# Patient Record
Sex: Female | Born: 1955 | Race: White | Hispanic: No | Smoking: Never smoker
Health system: Southern US, Community
[De-identification: ages and names within clinical notes are randomized; demographics above are authoritative.]

## PROBLEM LIST (undated history)

## (undated) DIAGNOSIS — H3530 Unspecified macular degeneration: Secondary | ICD-10-CM

## (undated) DIAGNOSIS — E669 Obesity, unspecified: Secondary | ICD-10-CM

## (undated) DIAGNOSIS — K5190 Ulcerative colitis, unspecified, without complications: Secondary | ICD-10-CM

## (undated) DIAGNOSIS — J45909 Unspecified asthma, uncomplicated: Secondary | ICD-10-CM

## (undated) DIAGNOSIS — E785 Hyperlipidemia, unspecified: Secondary | ICD-10-CM

## (undated) DIAGNOSIS — M810 Age-related osteoporosis without current pathological fracture: Secondary | ICD-10-CM

## (undated) DIAGNOSIS — T7840XA Allergy, unspecified, initial encounter: Secondary | ICD-10-CM

## (undated) DIAGNOSIS — C801 Malignant (primary) neoplasm, unspecified: Secondary | ICD-10-CM

## (undated) DIAGNOSIS — I10 Essential (primary) hypertension: Secondary | ICD-10-CM

## (undated) DIAGNOSIS — H269 Unspecified cataract: Secondary | ICD-10-CM

## (undated) DIAGNOSIS — D649 Anemia, unspecified: Secondary | ICD-10-CM

## (undated) DIAGNOSIS — F32A Depression, unspecified: Secondary | ICD-10-CM

## (undated) DIAGNOSIS — F329 Major depressive disorder, single episode, unspecified: Secondary | ICD-10-CM

## (undated) HISTORY — PX: COLONOSCOPY: SHX174

## (undated) HISTORY — DX: Unspecified asthma, uncomplicated: J45.909

## (undated) HISTORY — DX: Malignant (primary) neoplasm, unspecified: C80.1

## (undated) HISTORY — DX: Hyperlipidemia, unspecified: E78.5

## (undated) HISTORY — DX: Age-related osteoporosis without current pathological fracture: M81.0

## (undated) HISTORY — PX: CERVICAL CONE BIOPSY: SUR198

## (undated) HISTORY — DX: Depression, unspecified: F32.A

## (undated) HISTORY — DX: Essential (primary) hypertension: I10

## (undated) HISTORY — DX: Unspecified macular degeneration: H35.30

## (undated) HISTORY — DX: Ulcerative colitis, unspecified, without complications: K51.90

## (undated) HISTORY — DX: Obesity, unspecified: E66.9

## (undated) HISTORY — DX: Major depressive disorder, single episode, unspecified: F32.9

## (undated) HISTORY — DX: Anemia, unspecified: D64.9

## (undated) HISTORY — DX: Unspecified cataract: H26.9

## (undated) HISTORY — DX: Allergy, unspecified, initial encounter: T78.40XA

---

## 1998-12-25 ENCOUNTER — Other Ambulatory Visit: Admission: RE | Admit: 1998-12-25 | Discharge: 1998-12-25 | Payer: Self-pay | Admitting: *Deleted

## 2000-02-04 ENCOUNTER — Other Ambulatory Visit: Admission: RE | Admit: 2000-02-04 | Discharge: 2000-02-04 | Payer: Self-pay | Admitting: *Deleted

## 2001-03-05 ENCOUNTER — Ambulatory Visit (HOSPITAL_COMMUNITY): Admission: RE | Admit: 2001-03-05 | Discharge: 2001-03-05 | Payer: Self-pay | Admitting: Internal Medicine

## 2001-03-05 ENCOUNTER — Encounter: Payer: Self-pay | Admitting: Internal Medicine

## 2001-04-21 ENCOUNTER — Other Ambulatory Visit: Admission: RE | Admit: 2001-04-21 | Discharge: 2001-04-21 | Payer: Self-pay | Admitting: *Deleted

## 2002-02-18 ENCOUNTER — Ambulatory Visit (HOSPITAL_COMMUNITY): Admission: RE | Admit: 2002-02-18 | Discharge: 2002-02-18 | Payer: Self-pay | Admitting: Otolaryngology

## 2002-02-18 ENCOUNTER — Encounter: Payer: Self-pay | Admitting: Otolaryngology

## 2004-08-19 HISTORY — PX: CERVICAL FUSION: SHX112

## 2005-08-16 ENCOUNTER — Ambulatory Visit (HOSPITAL_COMMUNITY): Admission: RE | Admit: 2005-08-16 | Discharge: 2005-08-16 | Payer: Self-pay | Admitting: Neurosurgery

## 2005-11-20 ENCOUNTER — Other Ambulatory Visit: Admission: RE | Admit: 2005-11-20 | Discharge: 2005-11-20 | Payer: Self-pay | Admitting: Obstetrics and Gynecology

## 2007-01-05 ENCOUNTER — Ambulatory Visit: Payer: Self-pay | Admitting: Internal Medicine

## 2007-01-29 ENCOUNTER — Ambulatory Visit: Payer: Self-pay | Admitting: Internal Medicine

## 2007-04-17 ENCOUNTER — Other Ambulatory Visit: Admission: RE | Admit: 2007-04-17 | Discharge: 2007-04-17 | Payer: Self-pay | Admitting: Obstetrics and Gynecology

## 2008-06-01 ENCOUNTER — Ambulatory Visit (HOSPITAL_COMMUNITY): Admission: RE | Admit: 2008-06-01 | Discharge: 2008-06-01 | Payer: Self-pay | Admitting: Obstetrics and Gynecology

## 2008-06-09 ENCOUNTER — Other Ambulatory Visit: Admission: RE | Admit: 2008-06-09 | Discharge: 2008-06-09 | Payer: Self-pay | Admitting: Obstetrics and Gynecology

## 2009-08-07 ENCOUNTER — Ambulatory Visit (HOSPITAL_COMMUNITY): Admission: RE | Admit: 2009-08-07 | Discharge: 2009-08-07 | Payer: Self-pay | Admitting: Obstetrics and Gynecology

## 2009-09-11 ENCOUNTER — Other Ambulatory Visit: Admission: RE | Admit: 2009-09-11 | Discharge: 2009-09-11 | Payer: Self-pay | Admitting: Obstetrics and Gynecology

## 2009-10-05 ENCOUNTER — Ambulatory Visit (HOSPITAL_COMMUNITY): Admission: RE | Admit: 2009-10-05 | Discharge: 2009-10-05 | Payer: Self-pay | Admitting: Obstetrics and Gynecology

## 2010-09-05 ENCOUNTER — Ambulatory Visit (HOSPITAL_COMMUNITY)
Admission: RE | Admit: 2010-09-05 | Discharge: 2010-09-05 | Payer: Self-pay | Source: Home / Self Care | Attending: Obstetrics and Gynecology | Admitting: Obstetrics and Gynecology

## 2010-09-09 ENCOUNTER — Encounter: Payer: Self-pay | Admitting: Obstetrics and Gynecology

## 2010-10-30 ENCOUNTER — Other Ambulatory Visit (HOSPITAL_COMMUNITY)
Admission: RE | Admit: 2010-10-30 | Discharge: 2010-10-30 | Disposition: A | Discharge status: Home / Self Care | Payer: 59 | Source: Ambulatory Visit | Attending: Obstetrics and Gynecology | Admitting: Obstetrics and Gynecology

## 2010-10-30 ENCOUNTER — Other Ambulatory Visit: Payer: Self-pay | Admitting: Obstetrics and Gynecology

## 2010-10-30 DIAGNOSIS — Z01419 Encounter for gynecological examination (general) (routine) without abnormal findings: Secondary | ICD-10-CM | POA: Insufficient documentation

## 2010-11-08 LAB — CBC
Hemoglobin: 14.8 g/dL (ref 12.0–15.0)
MCV: 92.7 fL (ref 78.0–100.0)
Platelets: 197 10*3/uL (ref 150–400)
RDW: 13.4 % (ref 11.5–15.5)
WBC: 14.7 10*3/uL — ABNORMAL HIGH (ref 4.0–10.5)

## 2011-01-01 NOTE — Assessment & Plan Note (Signed)
Olivia Collier                         GASTROENTEROLOGY OFFICE NOTE   NAME:CLIBORNEWilder, Amodei                      MRN:          [id number]  DATE:01/05/2007                            DOB:          30-May-1956    Olivia Collier is a very nice 55 year old white female who is here today  to discuss colonoscopy. She has a strong family history of colon cancer  in her father and colon polyps in her brothers and sisters. Her last  colonoscopy was in 1996 and it was a normal exam. Olivia Collier has a  history of irritable bowel syndrome. She initially had predominantly  diarrhea. She was initially evaluated in 1994 by Dr. Corinda Gubler for  diarrhea. Her flexible sigmoidoscopy at that time was negative and the  biopsies did not show any evidence of colitis. She was treated  antispasmodics, Librax. She now has started taking Metamucil 1 heaping  teaspoon daily with marked improvement in  the urgency urgency. In the  last year, her symptoms have again recurred having diarrhea shortly  postprandially. Metamucil has made  it more controlled. She has  intentionally lost about 77 pounds as a result of daily exercise,(  walking about 4 miles a day.)   MEDICATIONS:  1. Metamucil 1 teaspoon daily.  2. Angeliq hormone 1 daily.  3. Ziac for blood pressure 10 mg daily.  4. Nasonex spray.  5. Prozac 40 mg daily.  6. Amoxicillin 500 mg b.i.d. for sinusitis.   PAST HISTORY:  High blood pressure, asthmatic bronchitis, cancer of the  cervix 20 years ago, allergies, chronic headaches.   OPERATIONS:  Colonoscopy in 1996.   FAMILY HISTORY:  Positive for colon cancer and colon polyp. Her brother  had heart disease. Mother and father had heart disease. Parents,  brother, and sister who has had diabetes.   SOCIAL HISTORY:  The patient is divorced. She does not smoke. She lives  alone and works in a [employer].   REVIEW OF SYSTEMS:  Positive for fever, eyeglasses,  allergies, night  sweats, back pain, sore throat.   PHYSICAL EXAMINATION:  VITAL SIGNS:  Blood pressure 120/86, pulse 72,  weight 207 pounds.  GENERAL:  She was very nice, alert and oriented, healthy appearing with  a good suntan.  HEENT:  Sclerae not icteric, oral cavity was normal.  NECK:  Supple without lymphadenopathy.  LUNGS:  Clear to auscultation.  CARDIAC: Normal, S1, S2.  ABDOMEN:  Soft, relaxed, with normal active bowel sounds. No palpable  tenderness.  Liver edge at costal margin.  RECTAL:  Normal perianal area, rectal tone was normal, stool was  hemoccult negative.  EXTREMITIES:  No edema.   IMPRESSION:  1. This is a 55 year old white female with a history of irritable      bowel syndrome, strong family history of colon cancer, also history      of colon polyps  in two direct relatives.  She is a good candidate      for a screening colonoscopy.  2. Irritable bowel syndrome, currently under better control using high      fiber and Metamucil.  PLAN:  1. Colonoscopy scheduled with routine colonoscopic prep.  2. Add Levbid 0.375 mg once or twice a day as an antispasmodic.  3. Continue high fiber diet and fiber supplements on daily basis.     Hedwig Morton. Juanda Chance, MD  Electronically Signed    DMB/MedQ  DD: 01/05/2007  DT: 01/06/2007  Job #: [id number]   cc:   Patrica Duel, M.D.

## 2011-01-04 NOTE — Op Note (Signed)
Olivia Collier, Olivia Collier               ACCOUNT NO.:  [id number]   MEDICAL RECORD NO.:  [id number]          PATIENT TYPE:  AMB   LOCATION:  SDS                          FACILITY:  [facility]   PHYSICIAN:  Reinaldo Meeker, M.D. 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   PROCEDURE:  C5-6, C6-7 anterior cervical discectomy with bone bank fusion  followed by Accufix anterior cervical plate.   SURGEON:  Reinaldo Meeker, M.D.   ASSISTANT:  Donalee Citrin, M.D.   DESCRIPTION OF PROCEDURE:  After being placed in the supine position in 10  pounds Halter traction, the patient's neck was prepped and draped in the  usual sterile fashion.  Localizing fluoroscopy was used prior to incision to  identify the appropriate level.  Transverse incision was made in the right  anterior neck starting at the midline and headed toward the medial aspect of  the sternocleidomastoid muscle.  Deep platysma muscle would have been  incised transversely.  The natural fascial plane between the strap muscles  medially and the sternocleidomastoid laterally was identified and followed  down t the anterior aspect of the cervical spine.  Longus colli muscles were  identified and split in the midline, stripped away bilaterally with Kitner  dissector unipolar coagulation.  Self-retaining retractor was placed for  exposure and x-ray showed approach at the appropriate level.  Using a 15  blade, the __________ disc at C5-6 and C6-7 was incised.  Using pituitary  rongeurs and curettes approximately __________ disc material at both levels  was removed.  High speed drill was used to widen both interspaces. The  microscope was draped and brought into the field and used for the remainder  of the case.  Using microdissection technique,  the remainder of disc  material around the posterior longitudinal ligament was removed.  The  ligament was then incised transversely.  Herniated disc material and bony  overgrowth were then removed to decompress the spinal dura and proximal  nerve roots bilaterally.  They were both found to be patent within their  foramen and attention was then turned to C6-7.  Similar procedure was then  carried out, once again removing the remainder of disc material down the  posterior longitudinal ligament.  Once again, the ligament was incised and  the cut edges were removed.  Once again, herniated disc material was removed  from the paramedian location bilaterally and nerve roots were followed out  their foramen.  At this point, inspection was carried out at both levels for  any evidence of residual compression and none could be identified.  At this  time, large amounts of irrigation were carried out.  Any bleeding was  controlled with bipolar coagulation.  Measurements were taken and a 7 mm and  8 mm bone bank plugs were reconstituted.  A 7 mm plug was then impacted at  C5-6 and an 8 mm plug  impacted at C6-7.  Fluoroscopy showed them to be in  good position.  An appropriate length Accufix anterior cervical plate was  then chosen.  Under fluoroscopic guidance, drill holes were placed followed  by placement of 13 mm screws x6.  Locking mechanism was secured at each  location and final fluoroscopy showed the plate, screws and plug to all be  in good position.  Large amounts of irrigation were carried out at this  time.  Any bleeding was controlled with bipolar coagulation.  The wound weakness then closed using interrupted Vicryl on the platysma  muscle an inverted 5-0 PDS on the subcuticular layer and Steri-Strips on the  skin.  Sterile dressing and soft collar were applied and the patient was  extubated and taken to the recovery room in stable condition.           ______________________________   Reinaldo Meeker, M.D.     ROK/MEDQ  D:  08/16/2005  T:  08/16/2005  Job:  [id number]

## 2011-09-20 ENCOUNTER — Other Ambulatory Visit (HOSPITAL_COMMUNITY): Payer: Self-pay | Admitting: Obstetrics and Gynecology

## 2011-09-20 DIAGNOSIS — Z1231 Encounter for screening mammogram for malignant neoplasm of breast: Secondary | ICD-10-CM

## 2011-10-17 ENCOUNTER — Ambulatory Visit (HOSPITAL_COMMUNITY)
Admission: RE | Admit: 2011-10-17 | Discharge: 2011-10-17 | Disposition: A | Discharge status: Home / Self Care | Payer: 59 | Source: Ambulatory Visit | Attending: Obstetrics and Gynecology | Admitting: Obstetrics and Gynecology

## 2011-10-17 DIAGNOSIS — Z1231 Encounter for screening mammogram for malignant neoplasm of breast: Secondary | ICD-10-CM | POA: Insufficient documentation

## 2011-10-31 ENCOUNTER — Encounter: Payer: Self-pay | Admitting: Internal Medicine

## 2011-12-03 ENCOUNTER — Other Ambulatory Visit: Payer: Self-pay | Admitting: Obstetrics and Gynecology

## 2011-12-03 ENCOUNTER — Other Ambulatory Visit (HOSPITAL_COMMUNITY)
Admission: RE | Admit: 2011-12-03 | Discharge: 2011-12-03 | Disposition: A | Discharge status: Home / Self Care | Payer: 59 | Source: Ambulatory Visit | Attending: Obstetrics and Gynecology | Admitting: Obstetrics and Gynecology

## 2011-12-03 DIAGNOSIS — Z01419 Encounter for gynecological examination (general) (routine) without abnormal findings: Secondary | ICD-10-CM | POA: Insufficient documentation

## 2012-09-07 ENCOUNTER — Encounter: Payer: Self-pay | Admitting: Internal Medicine

## 2012-09-25 ENCOUNTER — Ambulatory Visit (AMBULATORY_SURGERY_CENTER): Payer: 59 | Admitting: *Deleted

## 2012-09-25 ENCOUNTER — Encounter: Payer: Self-pay | Admitting: Internal Medicine

## 2012-09-25 VITALS — Ht 69.0 in | Wt 148.0 lb

## 2012-09-25 DIAGNOSIS — Z1211 Encounter for screening for malignant neoplasm of colon: Secondary | ICD-10-CM

## 2012-09-25 MED ORDER — MOVIPREP 100 G PO SOLR: ORAL | Status: DC

## 2012-10-09 ENCOUNTER — Ambulatory Visit (AMBULATORY_SURGERY_CENTER): Payer: 59 | Admitting: Internal Medicine

## 2012-10-09 ENCOUNTER — Encounter: Payer: Self-pay | Admitting: Internal Medicine

## 2012-10-09 VITALS — BP 123/68 | HR 70 | Temp 97.4°F | Resp 18 | Ht 69.0 in | Wt 148.0 lb

## 2012-10-09 DIAGNOSIS — Z800 Family history of malignant neoplasm of digestive organs: Secondary | ICD-10-CM

## 2012-10-09 DIAGNOSIS — Z1211 Encounter for screening for malignant neoplasm of colon: Secondary | ICD-10-CM

## 2012-10-09 MED ORDER — SODIUM CHLORIDE 0.9 % IV SOLN: 500.0000 mL | INTRAVENOUS | Status: DC

## 2012-10-09 NOTE — Patient Instructions (Addendum)
YOU HAD AN ENDOSCOPIC PROCEDURE TODAY AT THE [facility]: Refer to the procedure report that was given to you for any specific questions about what was found during the examination.  If the procedure report does not answer your questions, please call your gastroenterologist to clarify.  If you requested that your care partner not be given the details of your procedure findings, then the procedure report has been included in a sealed envelope for you to review at your convenience later.  YOU SHOULD EXPECT: Some feelings of bloating in the abdomen. Passage of more gas than usual.  Walking can help get rid of the air that was put into your GI tract during the procedure and reduce the bloating. If you had a lower endoscopy (such as a colonoscopy or flexible sigmoidoscopy) you may notice spotting of blood in your stool or on the toilet paper. If you underwent a bowel prep for your procedure, then you may not have a normal bowel movement for a few days.  DIET: Your first meal following the procedure should be a light meal and then it is ok to progress to your normal diet.  A half-sandwich or bowl of soup is an example of a good first meal.  Heavy or fried foods are harder to digest and may make you feel nauseous or bloated.  Likewise meals heavy in dairy and vegetables can cause extra gas to form and this can also increase the bloating.  Drink plenty of fluids but you should avoid alcoholic beverages for 24 hours.  ACTIVITY: Your care partner should take you home directly after the procedure.  You should plan to take it easy, moving slowly for the rest of the day.  You can resume normal activity the day after the procedure however you should NOT DRIVE or use heavy machinery for 24 hours (because of the sedation medicines used during the test).    SYMPTOMS TO REPORT IMMEDIATELY: A gastroenterologist can be reached at any hour.  During normal business hours, 8:30 AM to 5:00 PM Monday through Friday,  call [phone].  After hours and on weekends, please call the GI answering service at [phone] who will take a message and have the physician on call contact you.   Following lower endoscopy (colonoscopy or flexible sigmoidoscopy):  Excessive amounts of blood in the stool  Significant tenderness or worsening of abdominal pains  Swelling of the abdomen that is new, acute  Fever of 100F or higher    FOLLOW UP: If any biopsies were taken you will be contacted by phone or by letter within the next 1-3 weeks.  Call your gastroenterologist if you have not heard about the biopsies in 3 weeks.  Our staff will call the home number listed on your records the next business day following your procedure to check on you and address any questions or concerns that you may have at that time regarding the information given to you following your procedure. This is a courtesy call and so if there is no answer at the home number and we have not heard from you through the emergency physician on call, we will assume that you have returned to your regular daily activities without incident.  SIGNATURES/CONFIDENTIALITY: You and/or your care partner have signed paperwork which will be entered into your electronic medical record.  These signatures attest to the fact that that the information above on your After Visit Summary has been reviewed and is understood.  Full responsibility of the confidentiality   of this discharge information lies with you and/or your care-partner.     

## 2012-10-09 NOTE — Progress Notes (Signed)
Patient did not experience any of the following events: a burn prior to discharge; a fall within the facility; wrong site/side/patient/procedure/implant event; or a hospital transfer or hospital admission upon discharge from the facility. (G8907)  

## 2012-10-09 NOTE — Op Note (Signed)
[facility] [address] [place], [address]   COLONOSCOPY PROCEDURE REPORT  PATIENT: Olivia, Collier  MR#: [id number] BIRTHDATE: November 24, 1955 , 56  yrs. old GENDER: Female ENDOSCOPIST: Hart Carwin, MD REFERRED BY:  Assunta Found, M.D. PROCEDURE DATE:  10/09/2012 PROCEDURE:   Colonoscopy, screening ASA CLASS:   Class I INDICATIONS:High risk for colon cancer, positive family hx in a direct relative, also F hx of colon polyps, normal colonoscopy 1996 and 2008. MEDICATIONS: MAC sedation, administered by CRNA and Propofol (Diprivan) 220 mg IV  DESCRIPTION OF PROCEDURE:   After the risks and benefits and of the procedure were explained, informed consent was obtained.  A digital rectal exam revealed no abnormalities of the rectum.    The LB [id number]-[id number]  endoscope was introduced through the anus and advanced to the cecum, which was identified by both the appendix and ileocecal valve .  The quality of the prep was good, using MoviPrep .  The instrument was then slowly withdrawn as the colon was fully examined.     COLON FINDINGS: A normal appearing cecum, ileocecal valve, and appendiceal orifice were identified.  The ascending, hepatic flexure, transverse, splenic flexure, descending, sigmoid colon and rectum appeared unremarkable.  No polyps or cancers were seen. Retroflexed views revealed no abnormalities.     The scope was then withdrawn from the patient and the procedure completed.  COMPLICATIONS: There were no complications. ENDOSCOPIC IMPRESSION: Normal colon  RECOMMENDATIONS: High fiber diet   REPEAT EXAM: In 5 year(s)  for Colonoscopy.  cc:  _______________________________ eSignedHart Carwin, MD 10/09/2012 11:51 AM

## 2012-10-09 NOTE — Progress Notes (Signed)
Report to pacu rn, vss, bbs=clear 

## 2012-10-09 NOTE — Progress Notes (Signed)
Patient did not have preoperative order for IV antibiotic SSI prophylaxis. ([id number])   

## 2012-10-12 ENCOUNTER — Telehealth: Payer: Self-pay

## 2012-10-12 NOTE — Telephone Encounter (Signed)
Left message on number given in admitting

## 2012-10-26 ENCOUNTER — Other Ambulatory Visit: Payer: Self-pay

## 2012-10-26 DIAGNOSIS — Z1231 Encounter for screening mammogram for malignant neoplasm of breast: Secondary | ICD-10-CM

## 2012-11-20 ENCOUNTER — Ambulatory Visit
Admission: RE | Admit: 2012-11-20 | Discharge: 2012-11-20 | Disposition: A | Discharge status: Home / Self Care | Payer: 59 | Source: Ambulatory Visit

## 2012-11-20 DIAGNOSIS — Z1231 Encounter for screening mammogram for malignant neoplasm of breast: Secondary | ICD-10-CM

## 2013-03-22 ENCOUNTER — Other Ambulatory Visit: Payer: Self-pay | Admitting: Obstetrics and Gynecology

## 2013-03-22 ENCOUNTER — Other Ambulatory Visit (HOSPITAL_COMMUNITY)
Admission: RE | Admit: 2013-03-22 | Discharge: 2013-03-22 | Disposition: A | Discharge status: Home / Self Care | Payer: 59 | Source: Ambulatory Visit | Attending: Obstetrics and Gynecology | Admitting: Obstetrics and Gynecology

## 2013-03-22 DIAGNOSIS — Z1151 Encounter for screening for human papillomavirus (HPV): Secondary | ICD-10-CM | POA: Insufficient documentation

## 2013-03-22 DIAGNOSIS — Z01419 Encounter for gynecological examination (general) (routine) without abnormal findings: Secondary | ICD-10-CM | POA: Insufficient documentation

## 2013-10-20 ENCOUNTER — Other Ambulatory Visit (HOSPITAL_COMMUNITY): Payer: Self-pay | Admitting: Family Medicine

## 2013-10-20 DIAGNOSIS — J019 Acute sinusitis, unspecified: Secondary | ICD-10-CM

## 2013-10-20 DIAGNOSIS — J309 Allergic rhinitis, unspecified: Secondary | ICD-10-CM

## 2013-10-22 ENCOUNTER — Ambulatory Visit (HOSPITAL_COMMUNITY): Payer: 59

## 2013-12-22 ENCOUNTER — Other Ambulatory Visit: Payer: Self-pay

## 2013-12-22 DIAGNOSIS — Z1231 Encounter for screening mammogram for malignant neoplasm of breast: Secondary | ICD-10-CM

## 2013-12-29 ENCOUNTER — Ambulatory Visit
Admission: RE | Admit: 2013-12-29 | Discharge: 2013-12-29 | Disposition: A | Discharge status: Home / Self Care | Payer: 59 | Source: Ambulatory Visit

## 2013-12-29 ENCOUNTER — Encounter (INDEPENDENT_AMBULATORY_CARE_PROVIDER_SITE_OTHER): Payer: Self-pay

## 2013-12-29 DIAGNOSIS — Z1231 Encounter for screening mammogram for malignant neoplasm of breast: Secondary | ICD-10-CM

## 2014-04-04 ENCOUNTER — Other Ambulatory Visit (HOSPITAL_COMMUNITY)
Admission: RE | Admit: 2014-04-04 | Discharge: 2014-04-04 | Disposition: A | Discharge status: Home / Self Care | Payer: 59 | Source: Ambulatory Visit | Attending: Obstetrics and Gynecology | Admitting: Obstetrics and Gynecology

## 2014-04-04 ENCOUNTER — Other Ambulatory Visit: Payer: Self-pay | Admitting: Obstetrics and Gynecology

## 2014-04-04 DIAGNOSIS — Z01419 Encounter for gynecological examination (general) (routine) without abnormal findings: Secondary | ICD-10-CM | POA: Insufficient documentation

## 2014-04-07 LAB — CYTOLOGY - PAP

## 2014-06-29 ENCOUNTER — Encounter: Payer: Self-pay | Admitting: Internal Medicine

## 2014-09-02 ENCOUNTER — Encounter: Payer: Self-pay | Admitting: Internal Medicine

## 2014-09-02 ENCOUNTER — Ambulatory Visit (INDEPENDENT_AMBULATORY_CARE_PROVIDER_SITE_OTHER): Payer: 59 | Admitting: Internal Medicine

## 2014-09-02 ENCOUNTER — Other Ambulatory Visit (INDEPENDENT_AMBULATORY_CARE_PROVIDER_SITE_OTHER): Payer: 59

## 2014-09-02 VITALS — BP 142/80 | HR 85 | Ht 69.0 in | Wt 178.2 lb

## 2014-09-02 DIAGNOSIS — R109 Unspecified abdominal pain: Secondary | ICD-10-CM | POA: Diagnosis not present

## 2014-09-02 DIAGNOSIS — R197 Diarrhea, unspecified: Secondary | ICD-10-CM | POA: Diagnosis not present

## 2014-09-02 DIAGNOSIS — R140 Abdominal distension (gaseous): Secondary | ICD-10-CM

## 2014-09-02 LAB — CBC WITH DIFFERENTIAL/PLATELET
BASOS PCT: 0.6 % (ref 0.0–3.0)
Basophils Absolute: 0 10*3/uL (ref 0.0–0.1)
EOS ABS: 0.1 10*3/uL (ref 0.0–0.7)
Eosinophils Relative: 1.1 % (ref 0.0–5.0)
HEMATOCRIT: 35.7 % — AB (ref 36.0–46.0)
HEMOGLOBIN: 11.3 g/dL — AB (ref 12.0–15.0)
LYMPHS ABS: 2.7 10*3/uL (ref 0.7–4.0)
LYMPHS PCT: 35.1 % (ref 12.0–46.0)
MCHC: 31.5 g/dL (ref 30.0–36.0)
MCV: 84.8 fl (ref 78.0–100.0)
MONO ABS: 0.4 10*3/uL (ref 0.1–1.0)
Monocytes Relative: 5.8 % (ref 3.0–12.0)
NEUTROS ABS: 4.4 10*3/uL (ref 1.4–7.7)
NEUTROS PCT: 57.4 % (ref 43.0–77.0)
PLATELETS: 188 10*3/uL (ref 150.0–400.0)
RBC: 4.21 Mil/uL (ref 3.87–5.11)
RDW: 15.6 % — AB (ref 11.5–15.5)
WBC: 7.6 10*3/uL (ref 4.0–10.5)

## 2014-09-02 LAB — IBC PANEL
IRON: 27 ug/dL — AB (ref 42–145)
SATURATION RATIOS: 6.2 % — AB (ref 20.0–50.0)
Transferrin: 311 mg/dL (ref 212.0–360.0)

## 2014-09-02 LAB — VITAMIN D 25 HYDROXY (VIT D DEFICIENCY, FRACTURES): VITD: 34.8 ng/mL (ref 30.00–100.00)

## 2014-09-02 LAB — IGA: IGA: 110 mg/dL (ref 68–378)

## 2014-09-02 LAB — IRON: Iron: 27 ug/dL — ABNORMAL LOW (ref 42–145)

## 2014-09-02 LAB — FERRITIN: Ferritin: 5 ng/mL — ABNORMAL LOW (ref 10.0–291.0)

## 2014-09-02 LAB — VITAMIN B12: VITAMIN B 12: 218 pg/mL (ref 211–911)

## 2014-09-02 NOTE — Patient Instructions (Addendum)
You have been scheduled for an abdominal ultrasound at [facility] Radiology (1st floor of hospital) on 09/05/2014 at 9:30am. Please arrive 15 minutes prior to your appointment for registration. Make certain not to have anything to eat or drink 6 hours prior to your appointment. Should you need to reschedule your appointment, please contact radiology at [phone]. This test typically takes about 30 minutes to perform.   Go to the basement for labs today Dr Hilma Favors

## 2014-09-02 NOTE — Progress Notes (Signed)
Olivia Collier 10/08/1955 [id number]  Note: This dictation was prepared with Dragon digital system. Any transcriptional errors that result from this procedure are unintentional.   History of Present Illness: This is a 59 year old white female with history of irritable bowel syndrome. She comes today with complaints of  Bloating, fecal urgency and loose stools occurring over  past year. She started strict gluten-free diet on trial basis first week in December 2015 and has continued on it  to present time with complete resolution of all her digestive symptoms. Currently she is having regular bowel movements. There is a family history of gallbladder disease in her sister. One of her brothers has colitis. Her father had colon cancer and another brother and 2 sisters had colon polyps. Patient has had 3 colonoscopies one in 1996, 2008 and in February 2014 all exams were normal.    Past Medical History  Diagnosis Date  . Asthma   . Hypertension   . Depression   . Ulcerative colitis   . Obesity     Past Surgical History  Procedure Laterality Date  . Cervical fusion  2006    Allergies  Allergen Reactions  . Latex Anaphylaxis    Hives and sinusitis    Family history and social history have been reviewed.  Review of Systems: Negative for abdominal pain. Diarrhea. Weight loss  The remainder of the 10 point ROS is negative except as outlined in the H&P  Physical Exam: General Appearance Well developed, in no distress Eyes  Non icteric  HEENT  Non traumatic, normocephalic  Mouth No lesion, tongue papillated, no cheilosis Neck Supple without adenopathy, thyroid not enlarged, no carotid bruits, no JVD Lungs Clear to auscultation bilaterally COR Normal S1, normal S2, regular rhythm, no murmur, quiet precordium Abdomen soft, nontender. Normoactive bowel sounds. No tympany or distention Rectal not done Extremities  No pedal edema Skin No lesions Neurological Alert and oriented x  3 Psychological Normal mood and affect  Assessment and Plan:   59 year old white female with history of irritable bowel syndrome with recent exacerbation. She has modified her diet to gluten-free diet and has had marked improvement in her symptoms. She also has a positive family history of gallbladder disease. We will proceed with upper abdominal ultrasound. Sprue profile. IgA. CBC. Vitamin D levels, B12 and iron studies since she has been on strict gluten-free diet her sprue profile may be negative    He would then consider small bowel biopsy or  Gluten rechallenge  After being on regular diet  for4-6 weeks and repeating the blood test    Delfin Edis @TODAY (<PARAMETER> error)@

## 2014-09-05 ENCOUNTER — Ambulatory Visit (HOSPITAL_COMMUNITY): Payer: 59

## 2014-09-05 LAB — GLIADIN ANTIBODIES, SERUM
GLIADIN IGA: 2 U (ref <20)
Gliadin IgG: 1 Units (ref <20)

## 2014-09-05 LAB — TISSUE TRANSGLUTAMINASE, IGA: Tissue Transglutaminase Ab, IgA: 1 U/mL (ref <4)

## 2014-09-06 LAB — RETICULIN ANTIBODIES, IGA W TITER: RETICULIN AB, IGA: NEGATIVE

## 2014-09-07 ENCOUNTER — Ambulatory Visit (HOSPITAL_COMMUNITY)
Admission: RE | Admit: 2014-09-07 | Discharge: 2014-09-07 | Disposition: A | Discharge status: Home / Self Care | Payer: 59 | Source: Ambulatory Visit | Attending: Internal Medicine | Admitting: Internal Medicine

## 2014-09-07 DIAGNOSIS — R109 Unspecified abdominal pain: Secondary | ICD-10-CM | POA: Insufficient documentation

## 2014-09-07 DIAGNOSIS — R140 Abdominal distension (gaseous): Secondary | ICD-10-CM

## 2014-09-07 DIAGNOSIS — R197 Diarrhea, unspecified: Secondary | ICD-10-CM

## 2014-09-08 ENCOUNTER — Telehealth: Payer: Self-pay

## 2014-09-08 NOTE — Telephone Encounter (Signed)
Patient aware of her normal abd.u/s. She had negative sprue. She has tried to be careful with her diet, but she has had spells of "terrible" diarrhea that occur when she has to eat out. She asks what is the next step.

## 2014-09-08 NOTE — Telephone Encounter (Signed)
I suspect IBS- please send Levbid .375 mg, #60, 1 po bid., 1 refill.

## 2014-09-12 MED ORDER — HYOSCYAMINE SULFATE ER 0.375 MG PO TB12: 0.3750 mg | ORAL_TABLET | Freq: Two times a day (BID) | ORAL | Status: DC

## 2014-09-12 NOTE — Telephone Encounter (Signed)
Left message for pt to call back  °

## 2014-09-12 NOTE — Telephone Encounter (Signed)
Spoke with pt and she is aware, script sent to pharmacy. 

## 2015-01-04 ENCOUNTER — Other Ambulatory Visit: Payer: Self-pay

## 2015-01-04 DIAGNOSIS — Z1231 Encounter for screening mammogram for malignant neoplasm of breast: Secondary | ICD-10-CM

## 2015-01-31 ENCOUNTER — Ambulatory Visit
Admission: RE | Admit: 2015-01-31 | Discharge: 2015-01-31 | Disposition: A | Discharge status: Home / Self Care | Payer: 59 | Source: Ambulatory Visit

## 2015-01-31 ENCOUNTER — Encounter (INDEPENDENT_AMBULATORY_CARE_PROVIDER_SITE_OTHER): Payer: Self-pay

## 2015-01-31 DIAGNOSIS — Z1231 Encounter for screening mammogram for malignant neoplasm of breast: Secondary | ICD-10-CM

## 2015-04-10 ENCOUNTER — Other Ambulatory Visit (HOSPITAL_COMMUNITY)
Admission: RE | Admit: 2015-04-10 | Discharge: 2015-04-10 | Disposition: A | Discharge status: Home / Self Care | Payer: 59 | Source: Ambulatory Visit | Attending: Obstetrics and Gynecology | Admitting: Obstetrics and Gynecology

## 2015-04-10 ENCOUNTER — Other Ambulatory Visit: Payer: Self-pay | Admitting: Obstetrics and Gynecology

## 2015-04-10 DIAGNOSIS — Z01419 Encounter for gynecological examination (general) (routine) without abnormal findings: Secondary | ICD-10-CM | POA: Insufficient documentation

## 2015-04-13 LAB — CYTOLOGY - PAP

## 2016-03-19 ENCOUNTER — Other Ambulatory Visit: Payer: Self-pay | Admitting: Family Medicine

## 2016-03-19 DIAGNOSIS — Z1231 Encounter for screening mammogram for malignant neoplasm of breast: Secondary | ICD-10-CM

## 2016-03-28 ENCOUNTER — Ambulatory Visit
Admission: RE | Admit: 2016-03-28 | Discharge: 2016-03-28 | Disposition: A | Discharge status: Home / Self Care | Payer: 59 | Source: Ambulatory Visit | Attending: Family Medicine | Admitting: Family Medicine

## 2016-03-28 DIAGNOSIS — Z1231 Encounter for screening mammogram for malignant neoplasm of breast: Secondary | ICD-10-CM

## 2016-12-04 DIAGNOSIS — E782 Mixed hyperlipidemia: Secondary | ICD-10-CM | POA: Diagnosis not present

## 2016-12-04 DIAGNOSIS — Z1389 Encounter for screening for other disorder: Secondary | ICD-10-CM | POA: Diagnosis not present

## 2016-12-04 DIAGNOSIS — I10 Essential (primary) hypertension: Secondary | ICD-10-CM | POA: Diagnosis not present

## 2017-02-12 DIAGNOSIS — H8143 Vertigo of central origin, bilateral: Secondary | ICD-10-CM | POA: Diagnosis not present

## 2017-02-12 DIAGNOSIS — J322 Chronic ethmoidal sinusitis: Secondary | ICD-10-CM | POA: Diagnosis not present

## 2017-02-12 DIAGNOSIS — J320 Chronic maxillary sinusitis: Secondary | ICD-10-CM | POA: Diagnosis not present

## 2017-04-23 DIAGNOSIS — J019 Acute sinusitis, unspecified: Secondary | ICD-10-CM | POA: Diagnosis not present

## 2017-04-23 DIAGNOSIS — J069 Acute upper respiratory infection, unspecified: Secondary | ICD-10-CM | POA: Diagnosis not present

## 2017-06-06 ENCOUNTER — Other Ambulatory Visit: Payer: Self-pay | Admitting: Family Medicine

## 2017-06-06 DIAGNOSIS — Z1231 Encounter for screening mammogram for malignant neoplasm of breast: Secondary | ICD-10-CM

## 2017-06-26 ENCOUNTER — Ambulatory Visit
Admission: RE | Admit: 2017-06-26 | Discharge: 2017-06-26 | Disposition: A | Discharge status: Home / Self Care | Payer: 59 | Source: Ambulatory Visit | Attending: Family Medicine | Admitting: Family Medicine

## 2017-06-26 DIAGNOSIS — Z1231 Encounter for screening mammogram for malignant neoplasm of breast: Secondary | ICD-10-CM

## 2017-06-27 DIAGNOSIS — R51 Headache: Secondary | ICD-10-CM | POA: Diagnosis not present

## 2017-08-04 DIAGNOSIS — B370 Candidal stomatitis: Secondary | ICD-10-CM | POA: Diagnosis not present

## 2017-08-04 DIAGNOSIS — J320 Chronic maxillary sinusitis: Secondary | ICD-10-CM | POA: Diagnosis not present

## 2017-08-04 DIAGNOSIS — J301 Allergic rhinitis due to pollen: Secondary | ICD-10-CM | POA: Diagnosis not present

## 2017-08-05 DIAGNOSIS — D485 Neoplasm of uncertain behavior of skin: Secondary | ICD-10-CM | POA: Diagnosis not present

## 2017-08-05 DIAGNOSIS — L821 Other seborrheic keratosis: Secondary | ICD-10-CM | POA: Diagnosis not present

## 2017-08-05 DIAGNOSIS — B078 Other viral warts: Secondary | ICD-10-CM | POA: Diagnosis not present

## 2017-08-05 DIAGNOSIS — Z85828 Personal history of other malignant neoplasm of skin: Secondary | ICD-10-CM | POA: Diagnosis not present

## 2017-08-23 DIAGNOSIS — J9801 Acute bronchospasm: Secondary | ICD-10-CM | POA: Diagnosis not present

## 2017-08-23 DIAGNOSIS — J209 Acute bronchitis, unspecified: Secondary | ICD-10-CM | POA: Diagnosis not present

## 2017-08-23 DIAGNOSIS — R509 Fever, unspecified: Secondary | ICD-10-CM | POA: Diagnosis not present

## 2017-08-23 DIAGNOSIS — R05 Cough: Secondary | ICD-10-CM | POA: Diagnosis not present

## 2017-08-23 DIAGNOSIS — J019 Acute sinusitis, unspecified: Secondary | ICD-10-CM | POA: Diagnosis not present

## 2017-08-26 DIAGNOSIS — J069 Acute upper respiratory infection, unspecified: Secondary | ICD-10-CM | POA: Diagnosis not present

## 2017-08-26 DIAGNOSIS — J4520 Mild intermittent asthma, uncomplicated: Secondary | ICD-10-CM | POA: Diagnosis not present

## 2017-10-08 ENCOUNTER — Encounter: Payer: Self-pay | Admitting: Gastroenterology

## 2018-01-26 DIAGNOSIS — L720 Epidermal cyst: Secondary | ICD-10-CM | POA: Diagnosis not present

## 2018-01-26 DIAGNOSIS — L820 Inflamed seborrheic keratosis: Secondary | ICD-10-CM | POA: Diagnosis not present

## 2018-01-26 DIAGNOSIS — L578 Other skin changes due to chronic exposure to nonionizing radiation: Secondary | ICD-10-CM | POA: Diagnosis not present

## 2018-01-28 DIAGNOSIS — Z23 Encounter for immunization: Secondary | ICD-10-CM | POA: Diagnosis not present

## 2018-01-28 DIAGNOSIS — R21 Rash and other nonspecific skin eruption: Secondary | ICD-10-CM | POA: Diagnosis not present

## 2018-01-28 DIAGNOSIS — S30861A Insect bite (nonvenomous) of abdominal wall, initial encounter: Secondary | ICD-10-CM | POA: Diagnosis not present

## 2018-01-28 DIAGNOSIS — W57XXXA Bitten or stung by nonvenomous insect and other nonvenomous arthropods, initial encounter: Secondary | ICD-10-CM | POA: Diagnosis not present

## 2018-02-20 DIAGNOSIS — B9689 Other specified bacterial agents as the cause of diseases classified elsewhere: Secondary | ICD-10-CM | POA: Diagnosis not present

## 2018-02-20 DIAGNOSIS — J019 Acute sinusitis, unspecified: Secondary | ICD-10-CM | POA: Diagnosis not present

## 2018-02-20 DIAGNOSIS — J209 Acute bronchitis, unspecified: Secondary | ICD-10-CM | POA: Diagnosis not present

## 2018-02-25 DIAGNOSIS — R05 Cough: Secondary | ICD-10-CM | POA: Diagnosis not present

## 2018-02-25 DIAGNOSIS — J324 Chronic pansinusitis: Secondary | ICD-10-CM | POA: Diagnosis not present

## 2018-02-25 DIAGNOSIS — J410 Simple chronic bronchitis: Secondary | ICD-10-CM | POA: Diagnosis not present

## 2018-02-25 DIAGNOSIS — J370 Chronic laryngitis: Secondary | ICD-10-CM | POA: Diagnosis not present

## 2018-02-25 DIAGNOSIS — J320 Chronic maxillary sinusitis: Secondary | ICD-10-CM | POA: Diagnosis not present

## 2018-03-06 DIAGNOSIS — J4520 Mild intermittent asthma, uncomplicated: Secondary | ICD-10-CM | POA: Diagnosis not present

## 2018-03-06 DIAGNOSIS — E782 Mixed hyperlipidemia: Secondary | ICD-10-CM | POA: Diagnosis not present

## 2018-03-06 DIAGNOSIS — G894 Chronic pain syndrome: Secondary | ICD-10-CM | POA: Diagnosis not present

## 2018-03-06 DIAGNOSIS — K900 Celiac disease: Secondary | ICD-10-CM | POA: Diagnosis not present

## 2018-05-07 DIAGNOSIS — Z23 Encounter for immunization: Secondary | ICD-10-CM | POA: Diagnosis not present

## 2018-06-29 DIAGNOSIS — H8112 Benign paroxysmal vertigo, left ear: Secondary | ICD-10-CM | POA: Diagnosis not present

## 2018-06-29 DIAGNOSIS — B9689 Other specified bacterial agents as the cause of diseases classified elsewhere: Secondary | ICD-10-CM | POA: Diagnosis not present

## 2018-06-29 DIAGNOSIS — J329 Chronic sinusitis, unspecified: Secondary | ICD-10-CM | POA: Diagnosis not present

## 2018-09-10 ENCOUNTER — Other Ambulatory Visit: Payer: Self-pay | Admitting: Family Medicine

## 2018-09-10 DIAGNOSIS — Z1231 Encounter for screening mammogram for malignant neoplasm of breast: Secondary | ICD-10-CM

## 2018-09-14 ENCOUNTER — Ambulatory Visit
Admission: RE | Admit: 2018-09-14 | Discharge: 2018-09-14 | Disposition: A | Discharge status: Home / Self Care | Payer: 59 | Source: Ambulatory Visit | Attending: Family Medicine | Admitting: Family Medicine

## 2018-09-14 DIAGNOSIS — Z1231 Encounter for screening mammogram for malignant neoplasm of breast: Secondary | ICD-10-CM | POA: Diagnosis not present

## 2018-09-30 DIAGNOSIS — G894 Chronic pain syndrome: Secondary | ICD-10-CM | POA: Diagnosis not present

## 2018-09-30 DIAGNOSIS — Z1389 Encounter for screening for other disorder: Secondary | ICD-10-CM | POA: Diagnosis not present

## 2018-09-30 DIAGNOSIS — I10 Essential (primary) hypertension: Secondary | ICD-10-CM | POA: Diagnosis not present

## 2018-09-30 DIAGNOSIS — Z23 Encounter for immunization: Secondary | ICD-10-CM | POA: Diagnosis not present

## 2018-09-30 DIAGNOSIS — R7309 Other abnormal glucose: Secondary | ICD-10-CM | POA: Diagnosis not present

## 2018-09-30 DIAGNOSIS — E7849 Other hyperlipidemia: Secondary | ICD-10-CM | POA: Diagnosis not present

## 2018-10-20 ENCOUNTER — Encounter: Payer: Self-pay | Admitting: Gastroenterology

## 2018-10-22 DIAGNOSIS — H353132 Nonexudative age-related macular degeneration, bilateral, intermediate dry stage: Secondary | ICD-10-CM | POA: Diagnosis not present

## 2018-10-22 DIAGNOSIS — H43813 Vitreous degeneration, bilateral: Secondary | ICD-10-CM | POA: Diagnosis not present

## 2018-10-22 DIAGNOSIS — D3131 Benign neoplasm of right choroid: Secondary | ICD-10-CM | POA: Diagnosis not present

## 2018-11-08 ENCOUNTER — Telehealth: Payer: Self-pay | Admitting: *Deleted

## 2018-11-08 NOTE — Telephone Encounter (Signed)
Attempted to reach; unable to leave message. Will try again later with Covid 19 screening questions

## 2018-11-08 NOTE — Telephone Encounter (Signed)
Unable to reach patient to ask screening questions. 

## 2018-11-09 ENCOUNTER — Other Ambulatory Visit: Payer: Self-pay

## 2018-11-09 ENCOUNTER — Encounter: Payer: Self-pay | Admitting: Gastroenterology

## 2018-11-09 ENCOUNTER — Ambulatory Visit (AMBULATORY_SURGERY_CENTER): Payer: Self-pay

## 2018-11-09 VITALS — Temp 97.0°F | Ht 69.0 in | Wt 187.0 lb

## 2018-11-09 DIAGNOSIS — Z800 Family history of malignant neoplasm of digestive organs: Secondary | ICD-10-CM

## 2018-11-09 MED ORDER — NA SULFATE-K SULFATE-MG SULF 17.5-3.13-1.6 GM/177ML PO SOLN: 1.0000 | Freq: Once | ORAL | 0 refills | Status: AC

## 2018-11-09 NOTE — Progress Notes (Signed)
Denies allergies to eggs or soy products. Denies complication of anesthesia or sedation. Denies use of weight loss medication. Denies use of O2.   Emmi instructions declined.   A 15.00 coupon for Suprep was given to the patient.

## 2018-11-18 ENCOUNTER — Telehealth: Payer: Self-pay | Admitting: *Deleted

## 2018-11-18 NOTE — Telephone Encounter (Signed)
LM on VM that procedure is cancelled for 4/6 d/t covid restrictions..  Asked pt to call back to confirm.

## 2018-11-23 ENCOUNTER — Encounter: Payer: 59 | Admitting: Gastroenterology

## 2018-12-07 DIAGNOSIS — Z6826 Body mass index (BMI) 26.0-26.9, adult: Secondary | ICD-10-CM | POA: Diagnosis not present

## 2018-12-07 DIAGNOSIS — E663 Overweight: Secondary | ICD-10-CM | POA: Diagnosis not present

## 2018-12-07 DIAGNOSIS — J302 Other seasonal allergic rhinitis: Secondary | ICD-10-CM | POA: Diagnosis not present

## 2018-12-07 DIAGNOSIS — J0100 Acute maxillary sinusitis, unspecified: Secondary | ICD-10-CM | POA: Diagnosis not present

## 2018-12-23 ENCOUNTER — Telehealth: Payer: Self-pay | Admitting: *Deleted

## 2018-12-23 NOTE — Telephone Encounter (Signed)
Attempted to call patient to reschedule previously cancelled colonoscopy due to Covid-19. Unable to leave message on home phone as mailbox is full. Left message on cell phone for pt to return my call, verify rx and redo instructions.

## 2018-12-30 NOTE — Telephone Encounter (Signed)
LMOM for pt to call office to reschedule colonoscopy.

## 2019-02-16 ENCOUNTER — Encounter: Payer: Self-pay | Admitting: Gastroenterology

## 2019-06-23 ENCOUNTER — Other Ambulatory Visit: Payer: Self-pay | Admitting: *Deleted

## 2019-06-23 DIAGNOSIS — Z20822 Contact with and (suspected) exposure to covid-19: Secondary | ICD-10-CM

## 2019-06-24 LAB — NOVEL CORONAVIRUS, NAA: SARS-CoV-2, NAA: NOT DETECTED

## 2019-09-29 ENCOUNTER — Other Ambulatory Visit: Payer: Self-pay | Admitting: Family Medicine

## 2019-09-29 DIAGNOSIS — Z1231 Encounter for screening mammogram for malignant neoplasm of breast: Secondary | ICD-10-CM

## 2019-10-04 ENCOUNTER — Other Ambulatory Visit: Payer: Self-pay

## 2019-10-04 ENCOUNTER — Ambulatory Visit
Admission: RE | Admit: 2019-10-04 | Discharge: 2019-10-04 | Disposition: A | Discharge status: Home / Self Care | Payer: 59 | Source: Ambulatory Visit | Attending: Family Medicine | Admitting: Family Medicine

## 2019-10-04 DIAGNOSIS — Z1231 Encounter for screening mammogram for malignant neoplasm of breast: Secondary | ICD-10-CM

## 2021-01-18 DIAGNOSIS — M62838 Other muscle spasm: Secondary | ICD-10-CM | POA: Diagnosis not present

## 2021-03-27 DIAGNOSIS — Z1389 Encounter for screening for other disorder: Secondary | ICD-10-CM | POA: Diagnosis not present

## 2021-03-27 DIAGNOSIS — J329 Chronic sinusitis, unspecified: Secondary | ICD-10-CM | POA: Diagnosis not present

## 2021-03-27 DIAGNOSIS — K900 Celiac disease: Secondary | ICD-10-CM | POA: Diagnosis not present

## 2021-03-27 DIAGNOSIS — Z0001 Encounter for general adult medical examination with abnormal findings: Secondary | ICD-10-CM | POA: Diagnosis not present

## 2021-03-27 DIAGNOSIS — R7309 Other abnormal glucose: Secondary | ICD-10-CM | POA: Diagnosis not present

## 2021-03-27 DIAGNOSIS — J4520 Mild intermittent asthma, uncomplicated: Secondary | ICD-10-CM | POA: Diagnosis not present

## 2021-03-27 DIAGNOSIS — E559 Vitamin D deficiency, unspecified: Secondary | ICD-10-CM | POA: Diagnosis not present

## 2021-03-27 DIAGNOSIS — J302 Other seasonal allergic rhinitis: Secondary | ICD-10-CM | POA: Diagnosis not present

## 2021-03-27 DIAGNOSIS — E039 Hypothyroidism, unspecified: Secondary | ICD-10-CM | POA: Diagnosis not present

## 2021-03-27 DIAGNOSIS — I10 Essential (primary) hypertension: Secondary | ICD-10-CM | POA: Diagnosis not present

## 2021-03-27 DIAGNOSIS — E782 Mixed hyperlipidemia: Secondary | ICD-10-CM | POA: Diagnosis not present

## 2021-03-30 ENCOUNTER — Other Ambulatory Visit: Payer: Self-pay | Admitting: Family Medicine

## 2021-03-30 DIAGNOSIS — E2839 Other primary ovarian failure: Secondary | ICD-10-CM

## 2021-03-30 DIAGNOSIS — Z1231 Encounter for screening mammogram for malignant neoplasm of breast: Secondary | ICD-10-CM

## 2021-04-03 ENCOUNTER — Other Ambulatory Visit: Payer: Self-pay | Admitting: Family Medicine

## 2021-04-03 DIAGNOSIS — Z1231 Encounter for screening mammogram for malignant neoplasm of breast: Secondary | ICD-10-CM

## 2021-04-03 DIAGNOSIS — E2839 Other primary ovarian failure: Secondary | ICD-10-CM

## 2021-04-17 ENCOUNTER — Ambulatory Visit
Admission: RE | Admit: 2021-04-17 | Discharge: 2021-04-17 | Disposition: A | Discharge status: Home / Self Care | Payer: Self-pay | Source: Ambulatory Visit | Attending: Family Medicine | Admitting: Family Medicine

## 2021-04-17 ENCOUNTER — Other Ambulatory Visit: Payer: 59

## 2021-04-17 ENCOUNTER — Ambulatory Visit
Admission: RE | Admit: 2021-04-17 | Discharge: 2021-04-17 | Disposition: A | Discharge status: Home / Self Care | Payer: Medicare Other | Source: Ambulatory Visit | Attending: Family Medicine | Admitting: Family Medicine

## 2021-04-17 ENCOUNTER — Other Ambulatory Visit: Payer: Self-pay

## 2021-04-17 DIAGNOSIS — Z780 Asymptomatic menopausal state: Secondary | ICD-10-CM | POA: Diagnosis not present

## 2021-04-17 DIAGNOSIS — M8589 Other specified disorders of bone density and structure, multiple sites: Secondary | ICD-10-CM | POA: Diagnosis not present

## 2021-04-17 DIAGNOSIS — Z1231 Encounter for screening mammogram for malignant neoplasm of breast: Secondary | ICD-10-CM | POA: Diagnosis not present

## 2021-04-17 DIAGNOSIS — E2839 Other primary ovarian failure: Secondary | ICD-10-CM

## 2021-05-30 DIAGNOSIS — H353132 Nonexudative age-related macular degeneration, bilateral, intermediate dry stage: Secondary | ICD-10-CM | POA: Diagnosis not present

## 2021-06-26 ENCOUNTER — Encounter: Payer: Self-pay | Admitting: Gastroenterology

## 2021-06-26 ENCOUNTER — Ambulatory Visit: Payer: Medicare Other | Admitting: Gastroenterology

## 2021-06-26 ENCOUNTER — Other Ambulatory Visit (INDEPENDENT_AMBULATORY_CARE_PROVIDER_SITE_OTHER): Payer: Medicare Other

## 2021-06-26 VITALS — BP 132/78 | HR 67 | Ht 69.0 in | Wt 214.0 lb

## 2021-06-26 DIAGNOSIS — D509 Iron deficiency anemia, unspecified: Secondary | ICD-10-CM

## 2021-06-26 DIAGNOSIS — R197 Diarrhea, unspecified: Secondary | ICD-10-CM

## 2021-06-26 DIAGNOSIS — Z8619 Personal history of other infectious and parasitic diseases: Secondary | ICD-10-CM

## 2021-06-26 HISTORY — DX: Personal history of other infectious and parasitic diseases: Z86.19

## 2021-06-26 LAB — FERRITIN: Ferritin: 44.8 ng/mL (ref 10.0–291.0)

## 2021-06-26 LAB — CBC WITH DIFFERENTIAL/PLATELET
Basophils Absolute: 0 10*3/uL (ref 0.0–0.1)
Basophils Relative: 0.4 % (ref 0.0–3.0)
Eosinophils Absolute: 0.1 10*3/uL (ref 0.0–0.7)
Eosinophils Relative: 0.7 % (ref 0.0–5.0)
HCT: 42.8 % (ref 36.0–46.0)
Hemoglobin: 14.1 g/dL (ref 12.0–15.0)
Lymphocytes Relative: 25 % (ref 12.0–46.0)
Lymphs Abs: 2.2 10*3/uL (ref 0.7–4.0)
MCHC: 32.9 g/dL (ref 30.0–36.0)
MCV: 91.2 fl (ref 78.0–100.0)
Monocytes Absolute: 0.6 10*3/uL (ref 0.1–1.0)
Monocytes Relative: 6.5 % (ref 3.0–12.0)
Neutro Abs: 5.9 10*3/uL (ref 1.4–7.7)
Neutrophils Relative %: 67.4 % (ref 43.0–77.0)
Platelets: 224 10*3/uL (ref 150.0–400.0)
RBC: 4.69 Mil/uL (ref 3.87–5.11)
RDW: 13.2 % (ref 11.5–15.5)
WBC: 8.8 10*3/uL (ref 4.0–10.5)

## 2021-06-26 LAB — SEDIMENTATION RATE: Sed Rate: 7 mm/hr (ref 0–30)

## 2021-06-26 LAB — TSH: TSH: 2.25 u[IU]/mL (ref 0.35–5.50)

## 2021-06-26 LAB — C-REACTIVE PROTEIN: CRP: <1 mg/dL (ref 0.5–20.0)

## 2021-06-26 NOTE — Progress Notes (Addendum)
Referring Provider: Sharilyn Sites, MD Primary Care Physician:  Sharilyn Sites, MD   Reason for Consultation: Chronic diarrhea   IMPRESSION:  Family history of colon cancer and polyps: Last colonoscopy 2014. Due surveillance colonoscopy.   Chronic diarrhea with prior serologic evaluation negative for celiac. Prior diagnosis of IBS. The differential diagnosis of chronic diarrhea without alarm features includes: irritable bowel syndrome with temporal association of increased stress with recent family deaths, as well as microscopic colitis,  IBD, chronic infection (such as giardia), food intolerance, thyroid disorder, other functional GI disease. By history, this is less likely to be obstruction.  History of iron deficiency. However, she notes annual labs with Dr. Hilma Favors were obtained since onset of worsening diarrhea were normal. I do not have access to them today but will request them for review. May need to consider concurrent EGD if iron deficiency is present.     PLAN: - Obtain labs from Dr. Hilma Favors including CBC and TSH - Fecal calprotectin, ESR, and CRP to screen for IBD - Giardia testing - Avoid milk products, raw fruits, raw vegetables, high fat foods, artifical sweeteners, and carbonated beverages until symptoms resolve - Colonoscopy due for surveillance, will plan random biopsies and evaluation of the terminal ileum given her diarrhea - Consider bismuth salicylate for control of diarrhea in the meantime  I spent 45 minutes, including in depth chart review, independent review of results, communicating results with the patient directly, face-to-face time with the patient, coordinating care, ordering studies and medications as appropriate, and documentation.    HPI: Olivia Collier is a 65 y.o. female referred by Dr. Hilma Favors for further evaluation of chronic diarrhea.  The history is obtained to the patient and review of her electronic health record. She has a history of depression  and hypertension.  Previously followed by Dr. Maurene Capes for IBS with bloating, fecal urgency, and loose stools.  Also has a history of recurrent colonic infections treated with metronidazole over the years. Notes history of IBS started during a divorce. Last seen in 2016.   Presents today reports years of watery, explosive diarrhea with associated fecal incontinence. Two bowel movements every morning that occur approximately 1 hour after eating. Associated bloating. There is no nausea, vomiting, or abdominal pain. Appetite is good. Weight is stable. No blood or mucous in the stool. Recent antibiotics for sinus infection.  Denies any other precipitating event, trauma, close contacts with similar symptoms, recent travel. Spicy foods trigger her symptoms.  Temporal association with stress around death of family members and stress managing the estate. Weight gain since retiring January 2022 after 40 years at [employer] daily for many years. Will use imodium PRN with success.   She has had 3 prior normal colonoscopies in 1996, 2008, and 2014.   EGD in 1994 showed mild gastritis and duodenitis. Esophageal biopsies showed esophagitis.  Father had colon cancer at age 48.  Two brothers and one sister had colon polyps.  No recent abdominal imaging.  She had an abdominal ultrasound in 2016 for abdominal pain that was normal.  In 2016 she had a ferritin of 5 and iron of 27.  Celiac serologies including gliadin antibodies and TTG a were normal at that time.  Most recent labs available to me show a CBC from 2019 with a hemoglobin of 12.3, MCV 96.4, RDW 12.9, platelets 165  Past Medical History:  Diagnosis Date   Allergy    Asthma    Cataract    Depression  Hypertension    Obesity    Ulcerative colitis (HCC)     Past Surgical History:  Procedure Laterality Date   CERVICAL FUSION  2006   Current Outpatient Medications  Medication Sig Dispense Refill   bisoprolol-hydrochlorothiazide  (ZIAC) 10-6.25 MG tablet Take 1 tablet by mouth daily.     cetirizine-pseudoephedrine (ZYRTEC-D) 5-120 MG per tablet Take 1 tablet by mouth 2 (two) times daily.     ECHINACEA HERB PO Take 1 tablet by mouth daily.     FLUoxetine (PROZAC) 20 MG tablet Take 20 mg by mouth daily.     pseudoephedrine-acetaminophen (TYLENOL SINUS) 30-500 MG TABS Take 1 tablet by mouth every 4 (four) hours as needed.     No current facility-administered medications for this visit.    Allergies as of 06/26/2021 - Review Complete 06/26/2021  Allergen Reaction Noted   Latex Anaphylaxis 09/25/2012    Family History  Problem Relation Age of Onset   Colon cancer Father    Breast cancer Sister 75   Colon polyps Sister    Colon polyps Sister    Colon polyps Brother    Colon polyps Brother    Esophageal cancer Neg Hx    Rectal cancer Neg Hx    Stomach cancer Neg Hx      Review of Systems: 12 system ROS is negative except as noted above with the addition of allergies, back pain, depression, and insomnia.   Physical Exam: General:   Alert,  well-nourished, pleasant and cooperative in NAD Head:  Normocephalic and atraumatic. Eyes:  Sclera clear, no icterus.   Conjunctiva pink. Ears:  Normal auditory acuity. Nose:  No deformity, discharge,  or lesions. Mouth:  No deformity or lesions.   Neck:  Supple; no masses or thyromegaly. Lungs:  Clear throughout to auscultation.   No wheezes. Heart:  Regular rate and rhythm; no murmurs. Abdomen:  Soft, nontender, nondistended, normal bowel sounds, no rebound or guarding. No hepatosplenomegaly.   Rectal:  Deferred  Msk:  Symmetrical. No boney deformities LAD: No inguinal or umbilical LAD Extremities:  No clubbing or edema. Neurologic:  Alert and  oriented x4;  grossly nonfocal Skin:  Intact without significant lesions or rashes. Psych:  Alert and cooperative. Normal mood and affect.     L. , MD, MPH 06/26/2021, 10:26 AM      

## 2021-06-26 NOTE — Patient Instructions (Addendum)
It was a pleasure to meet you today.  I have recommended some labs and stool studies to evaluate your diarrhea. We will also try to obtain your recent lab results from Dr. Hilma Favors.  I have recommended a colonoscopy with biopsies to look for colitis including microscopic colitis.  In the meantime, you may want to try bismuth salicylate two tablets every 30 minutes for eight doses to control your diarrhea without resulting in constipation. Pepto-Bismol may make your stools black and change the texture.  Tips for colonoscopy:  - Stay well hydrated for 3-4 days prior to the exam. This reduces nausea and dehydration.  - To prevent skin/hemorrhoid irritation - prior to wiping, put A&Dointment or vaseline on the toilet paper. - Keep a towel or pad on the bed.  - Drink  64oz of clear liquids in the morning of prep day (prior to starting the prep) to be sure that there is enough fluid to flush the colon and stay hydrated!!!! This is in addition to the fluids required for preparation. - Use of a flavored hard candy, such as grape Anise Salvo, can counteract some of the flavor of the prep and may prevent some nausea.   Your provider has requested that you go to the basement level for lab work before leaving today. Press "B" on the elevator. The lab is located at the first door on the left as you exit the elevator.  It has been recommended to you by your physician that you have a(n) Colonoscopy completed. Per your request, we did not schedule the procedure(s) today. Please contact our office at [phone] should you decide to have the procedure completed. We have given you a schedule of available dates and times for you to coordinate with your healthcare partner.

## 2021-06-27 ENCOUNTER — Other Ambulatory Visit: Payer: Medicare Other

## 2021-06-27 DIAGNOSIS — R197 Diarrhea, unspecified: Secondary | ICD-10-CM

## 2021-06-27 LAB — IRON, TOTAL/TOTAL IRON BINDING CAP
%SAT: 16 % (calc) (ref 16–45)
Iron: 49 ug/dL (ref 45–160)
TIBC: 307 mcg/dL (calc) (ref 250–450)

## 2021-06-28 ENCOUNTER — Other Ambulatory Visit: Payer: Self-pay

## 2021-06-28 DIAGNOSIS — A09 Infectious gastroenteritis and colitis, unspecified: Secondary | ICD-10-CM

## 2021-06-28 DIAGNOSIS — A071 Giardiasis [lambliasis]: Secondary | ICD-10-CM

## 2021-06-28 LAB — GIARDIA ANTIGEN
MICRO NUMBER:: 12614894
RESULT:: DETECTED — AB
SPECIMEN QUALITY:: ADEQUATE

## 2021-06-28 MED ORDER — TINIDAZOLE 500 MG PO TABS: 2.0000 g | ORAL_TABLET | Freq: Once | ORAL | 0 refills | Status: AC

## 2021-06-30 LAB — CALPROTECTIN, FECAL: Calprotectin, Fecal: <16 ug/g (ref 0–120)

## 2021-07-19 ENCOUNTER — Encounter: Payer: Self-pay | Admitting: Gastroenterology

## 2021-07-19 ENCOUNTER — Telehealth: Payer: Self-pay | Admitting: *Deleted

## 2021-07-19 ENCOUNTER — Other Ambulatory Visit: Payer: Self-pay

## 2021-07-19 ENCOUNTER — Ambulatory Visit (AMBULATORY_SURGERY_CENTER): Payer: Self-pay | Admitting: *Deleted

## 2021-07-19 VITALS — Ht 69.0 in | Wt 214.0 lb

## 2021-07-19 DIAGNOSIS — D509 Iron deficiency anemia, unspecified: Secondary | ICD-10-CM

## 2021-07-19 DIAGNOSIS — A09 Infectious gastroenteritis and colitis, unspecified: Secondary | ICD-10-CM

## 2021-07-19 DIAGNOSIS — Z800 Family history of malignant neoplasm of digestive organs: Secondary | ICD-10-CM

## 2021-07-19 NOTE — Progress Notes (Signed)
No egg or soy allergy known to patient  No issues known to pt with past sedation with any surgeries or procedures Patient denies ever being told they had issues or difficulty with intubation  No FH of Malignant Hyperthermia Pt is not on diet pills Pt is not on  home 02  Pt is not on blood thinners  Pt denies issues with constipation  No A fib or A flutter  Pt is fully vaccinated  for Covid    NO PA's for preps discussed with pt In PV today  Discussed with pt there will be an out-of-pocket cost for prep and that varies from $0 to 70 +  dollars - pt verbalized understanding   Due to the COVID-19 pandemic we are asking patients to follow certain guidelines in PV and the [place]   Pt aware of COVID protocols and [facility] guidelines   Pt treated for Giardia 06-26-2021 per Dr Tarri Glenn and completed antibiotic

## 2021-07-19 NOTE — Telephone Encounter (Signed)
Ammie,  Can you please place an order for Mrs Abend to repeat a GI Pathogen panel per  Dr Tarri Glenn  and I will call her and advise of the need  thanks  Lelan Pons PV

## 2021-07-19 NOTE — Telephone Encounter (Signed)
Dr Tarri Glenn,  I just completed a PV on Mrs Olivia Collier- she has a Colon 08-02-2021. She saw you in the office 06-26-2021 and had stool studies that were positive for Giardia- she was treated and has completed her Antibiotics for this infection.  She told me at [facility] her Diarrhea stopped but in the last 3 days has returned.   Do we need to do any further medications or testing prior to her colon 12-15?  Please advise, Thanks   Lelan Pons PV

## 2021-07-19 NOTE — Telephone Encounter (Signed)
Called pt- she will come by the lab and pick up GI path containers and collect specimen and bring it back   [facility]

## 2021-07-20 ENCOUNTER — Other Ambulatory Visit: Payer: Medicare Other

## 2021-07-23 ENCOUNTER — Other Ambulatory Visit: Payer: Medicare Other

## 2021-07-23 DIAGNOSIS — A09 Infectious gastroenteritis and colitis, unspecified: Secondary | ICD-10-CM | POA: Diagnosis not present

## 2021-07-23 DIAGNOSIS — Z0389 Encounter for observation for other suspected diseases and conditions ruled out: Secondary | ICD-10-CM | POA: Diagnosis not present

## 2021-07-23 DIAGNOSIS — A048 Other specified bacterial intestinal infections: Secondary | ICD-10-CM | POA: Diagnosis not present

## 2021-07-26 LAB — GI PROFILE, STOOL, PCR

## 2021-08-02 ENCOUNTER — Ambulatory Visit (AMBULATORY_SURGERY_CENTER): Payer: Medicare Other | Admitting: Gastroenterology

## 2021-08-02 ENCOUNTER — Encounter: Payer: Medicare Other | Admitting: Gastroenterology

## 2021-08-02 ENCOUNTER — Encounter: Payer: Self-pay | Admitting: Gastroenterology

## 2021-08-02 VITALS — BP 138/80 | HR 64 | Temp 98.0°F | Resp 11 | Ht 69.0 in | Wt 214.0 lb

## 2021-08-02 DIAGNOSIS — Z800 Family history of malignant neoplasm of digestive organs: Secondary | ICD-10-CM

## 2021-08-02 DIAGNOSIS — R197 Diarrhea, unspecified: Secondary | ICD-10-CM | POA: Diagnosis not present

## 2021-08-02 DIAGNOSIS — E785 Hyperlipidemia, unspecified: Secondary | ICD-10-CM | POA: Diagnosis not present

## 2021-08-02 DIAGNOSIS — I10 Essential (primary) hypertension: Secondary | ICD-10-CM | POA: Diagnosis not present

## 2021-08-02 DIAGNOSIS — Z8371 Family history of colonic polyps: Secondary | ICD-10-CM

## 2021-08-02 MED ORDER — SODIUM CHLORIDE 0.9 % IV SOLN: 500.0000 mL | Freq: Once | INTRAVENOUS | Status: DC

## 2021-08-02 NOTE — Progress Notes (Signed)
To PACU, VSS. Report to Rn.tb 

## 2021-08-02 NOTE — Progress Notes (Signed)
Called to room to assist during endoscopic procedure.  Patient ID and intended procedure confirmed with present staff. Received instructions for my participation in the procedure from the performing physician.  

## 2021-08-02 NOTE — Progress Notes (Signed)
History & Physical  Primary Care Physician:  Sharilyn Sites, MD Primary Gastroenterologist: Thornton Park, MD  CHIEF COMPLAINT:  Family history of colon cancer, Family history of colon polyps, Diarrhea   HPI: Olivia Collier is a 65 y.o. female with a family history of colon cancer, father, and a family history of colon polyps in 4 siblings for colonoscopy. Recently identified giardia was treated with tinidazole however her diarrhea persists.   Past Medical History:  Diagnosis Date   Allergy    Anemia    Asthma    Cancer ([place])    cervical cancer age 9- no chemo, no radiation   Cataract    forming   Depression    History of giardia infection 06/26/2021   treated by [facility]   Hyperlipidemia    Hypertension    Macular degeneration    Obesity    Osteoporosis    past hx   Ulcerative colitis ([place])    past hx    Past Surgical History:  Procedure Laterality Date   CERVICAL CONE BIOPSY     CERVICAL FUSION  08/19/2004   COLONOSCOPY      Prior to Admission medications   Medication Sig Start Date End Date Taking? Authorizing Provider  bisoprolol-hydrochlorothiazide (ZIAC) 10-6.25 MG tablet Take 1 tablet by mouth daily.   Yes [provider]  Calcium-Vitamin D-Vitamin K (CALCIUM + D + K PO) Take by mouth daily.   Yes [provider]  cetirizine-pseudoephedrine (ZYRTEC-D) 5-120 MG per tablet Take 1 tablet by mouth 2 (two) times daily.   Yes [provider]  ECHINACEA HERB PO Take 1 tablet by mouth daily.   Yes [provider]  FLUoxetine (PROZAC) 20 MG capsule Take 20 mg by mouth 2 (two) times daily. 06/20/21  Yes [provider]  Multiple Vitamins-Minerals (ICAPS AREDS 2 PO) Take by mouth.   Yes [provider]  OVER THE COUNTER MEDICATION Focus Factor daily   Yes [provider]  pseudoephedrine-acetaminophen (TYLENOL SINUS) 30-500 MG TABS Take 1 tablet by mouth every 4 (four) hours as needed.   Yes [provider]  traMADol Veatrice Bourbon) 50 MG tablet  05/30/21  Yes [provider]    Current Outpatient Medications  Medication Sig Dispense Refill   bisoprolol-hydrochlorothiazide (ZIAC) 10-6.25 MG tablet Take 1 tablet by mouth daily.     Calcium-Vitamin D-Vitamin K (CALCIUM + D + K PO) Take by mouth daily.     cetirizine-pseudoephedrine (ZYRTEC-D) 5-120 MG per tablet Take 1 tablet by mouth 2 (two) times daily.     ECHINACEA HERB PO Take 1 tablet by mouth daily.     FLUoxetine (PROZAC) 20 MG capsule Take 20 mg by mouth 2 (two) times daily.     Multiple Vitamins-Minerals (ICAPS AREDS 2 PO) Take by mouth.     OVER THE COUNTER MEDICATION Focus Factor daily     pseudoephedrine-acetaminophen (TYLENOL SINUS) 30-500 MG TABS Take 1 tablet by mouth every 4 (four) hours as needed.     traMADol (ULTRAM) 50 MG tablet      Current Facility-Administered Medications  Medication Dose Route Frequency Provider Last Rate Last Admin   0.9 %  sodium chloride infusion  500 mL Intravenous Once Ladene Artist, MD        Allergies as of 08/02/2021 - Review Complete 08/02/2021  Allergen Reaction Noted   Latex Anaphylaxis 09/25/2012    Family History  Problem Relation Age of Onset   Colon cancer Father  Breast cancer Sister 57   Colon polyps Sister    Colon polyps Sister    Colon polyps Brother    Colon polyps Brother    Esophageal cancer Neg Hx    Rectal cancer Neg Hx    Stomach cancer Neg Hx     Social History   Socioeconomic History   Marital status: Divorced    Spouse name: Not on file   Number of children: 0   Years of education: Not on file   Highest education level: Not on file  Occupational History   Occupation: Retired  Tobacco Use   Smoking status: Never   Smokeless tobacco: Never  Vaping Use   Vaping Use: Never used  Substance and Sexual Activity   Alcohol use: No   Drug use: No   Sexual activity: Not on file  Other Topics Concern   Not on file  Social History  Narrative   Not on file   Social Determinants of Health   Financial Resource Strain: Not on file  Food Insecurity: Not on file  Transportation Needs: Not on file  Physical Activity: Not on file  Stress: Not on file  Social Connections: Not on file  Intimate Partner Violence: Not on file    Review of Systems:  All systems reviewed an negative except where noted in HPI.  Gen: Denies any fever, chills, sweats, anorexia, fatigue, weakness, malaise, weight loss, and sleep disorder CV: Denies chest pain, angina, palpitations, syncope, orthopnea, PND, peripheral edema, and claudication. Resp: Denies dyspnea at rest, dyspnea with exercise, cough, sputum, wheezing, coughing up blood, and pleurisy. GI: Denies vomiting blood, jaundice, and fecal incontinence.   Denies dysphagia or odynophagia. GU : Denies urinary burning, blood in urine, urinary frequency, urinary hesitancy, nocturnal urination, and urinary incontinence. MS: Denies joint pain, limitation of movement, and swelling, stiffness, low back pain, extremity pain. Denies muscle weakness, cramps, atrophy.  Derm: Denies rash, itching, dry skin, hives, moles, warts, or unhealing ulcers.  Psych: Denies depression, anxiety, memory loss, suicidal ideation, hallucinations, paranoia, and confusion. Heme: Denies bruising, bleeding, and enlarged lymph nodes. Neuro:  Denies any headaches, dizziness, paresthesias. Endo:  Denies any problems with DM, thyroid, adrenal function.   Physical Exam: General:  Alert, well-developed, in NAD Head:  Normocephalic and atraumatic. Eyes:  Sclera clear, no icterus.   Conjunctiva pink. Ears:  Normal auditory acuity. Mouth:  No deformity or lesions.  Neck:  Supple; no masses . Lungs:  Clear throughout to auscultation.   No wheezes, crackles, or rhonchi. No acute distress. Heart:  Regular rate and rhythm; no murmurs. Abdomen:  Soft, nondistended, nontender. No masses, hepatomegaly. No obvious masses.  Normal  bowel .    Rectal:  Deferred   Msk:  Symmetrical without gross deformities.. Pulses:  Normal pulses noted. Extremities:  Without edema. Neurologic:  Alert and  oriented x4;  grossly normal neurologically. Skin:  Intact without significant lesions or rashes. Cervical Nodes:  No significant cervical adenopathy. Psych:  Alert and cooperative. Normal mood and affect.   Impression / Plan:   Family history of colon cancer, father, and a family history of colon polyps in 4 siblings for colonoscopy.  Recently identified giardia was treated with tinidazole however her diarrhea persists.    Pricilla Riffle. Fuller Plan  08/02/2021, 3:31 PM See Shea Evans, [facility], to contact our on call provider

## 2021-08-02 NOTE — Progress Notes (Signed)
VS by CW  Pt's states no medical or surgical changes since previsit or office visit.  

## 2021-08-02 NOTE — Op Note (Signed)
[name] Procedure Date: 08/02/2021 3:26 PM MRN: [id number] Endoscopist: Ladene Artist , MD Age: 65 Referring MD:  Date of Birth: 11-Apr-1956 Gender: Female Account #: [id number] Procedure:                Colonoscopy Indications:              Chronic diarrhea, Family history of colon cancer,                            1st degree relative, Family history of colon                            polyps, multiple 1st degree relatives Medicines:                Monitored Anesthesia Care Procedure:                Pre-Anesthesia Assessment:                           - Prior to the procedure, a History and Physical                            was performed, and patient medications and                            allergies were reviewed. The patient's tolerance of                            previous anesthesia was also reviewed. The risks                            and benefits of the procedure and the sedation                            options and risks were discussed with the patient.                            All questions were answered, and informed consent                            was obtained. Prior Anticoagulants: The patient has                            taken no previous anticoagulant or antiplatelet                            agents. ASA Grade Assessment: II - A patient with                            mild systemic disease. After reviewing the risks                            and benefits, the patient was deemed in  satisfactory condition to undergo the procedure.                           After obtaining informed consent, the colonoscope                            was passed under direct vision. Throughout the                            procedure, the patient's blood pressure, pulse, and                            oxygen saturations were monitored continuously. The                            CF [id number] #[id number] was  introduced through the anus                            and advanced to the the cecum, identified by                            appendiceal orifice and ileocecal valve. The                            ileocecal valve, appendiceal orifice, and rectum                            were photographed. The quality of the bowel                            preparation was good. The colonoscopy was performed                            without difficulty. The patient tolerated the                            procedure well. Scope In: 3:41:09 PM Scope Out: 3:57:54 PM Scope Withdrawal Time: 0 hours 12 minutes 21 seconds  Total Procedure Duration: 0 hours 16 minutes 45 seconds  Findings:                 The perianal and digital rectal examinations were                            normal.                           The terminal ileum appeared normal.                           Internal hemorrhoids were found during                            retroflexion. The hemorrhoids were small and Grade  I (internal hemorrhoids that do not prolapse).                           The exam was otherwise without abnormality on                            direct and retroflexion views. Random biopsies                            obtained throughout. Complications:            No immediate complications. Estimated blood loss:                            None. Estimated Blood Loss:     Estimated blood loss: none. Impression:               - The examined portion of the ileum was normal.                           - Internal hemorrhoids.                           - The examination was otherwise normal on direct                            and retroflexion views. Random biopsies.                           - No specimens collected. Recommendation:           - Repeat colonoscopy in 5 years for surveillance                            with Dr. Tarri Glenn.                           - Patient has a contact number  available for                            emergencies. The signs and symptoms of potential                            delayed complications were discussed with the                            patient. Return to normal activities tomorrow.                            Written discharge instructions were provided to the                            patient.                           - Resume previous diet.                           -  Continue present medications.                           - Await pathology results. Ladene Artist, MD 08/02/2021 4:02:13 PM This report has been signed electronically.

## 2021-08-02 NOTE — Patient Instructions (Signed)
YOU HAD AN ENDOSCOPIC PROCEDURE TODAY AT [facility]:   Refer to the procedure report that was given to you for any specific questions about what was found during the examination.  If the procedure report does not answer your questions, please call your gastroenterologist to clarify.  If you requested that your care partner not be given the details of your procedure findings, then the procedure report has been included in a sealed envelope for you to review at your convenience later. Handout provided on hemorrhoids.   Repeat colonoscopy in 5 years for screening, with Dr. Modena Nunnery.   YOU SHOULD EXPECT: Some feelings of bloating in the abdomen. Passage of more gas than usual.  Walking can help get rid of the air that was put into your GI tract during the procedure and reduce the bloating. If you had a lower endoscopy (such as a colonoscopy or flexible sigmoidoscopy) you may notice spotting of blood in your stool or on the toilet paper. If you underwent a bowel prep for your procedure, you may not have a normal bowel movement for a few days.  Please Note:  You might notice some irritation and congestion in your nose or some drainage.  This is from the oxygen used during your procedure.  There is no need for concern and it should clear up in a day or so.  SYMPTOMS TO REPORT IMMEDIATELY:  Following lower endoscopy (colonoscopy or flexible sigmoidoscopy):  Excessive amounts of blood in the stool  Significant tenderness or worsening of abdominal pains  Swelling of the abdomen that is new, acute  Fever of 100F or higher  For urgent or emergent issues, a gastroenterologist can be reached at any hour by calling [phone]. Do not use MyChart messaging for urgent concerns.    DIET:  We do recommend a small meal at first, but then you may proceed to your regular diet.  Drink plenty of fluids but you should avoid alcoholic beverages for 24 hours.  ACTIVITY:  You should plan to take it  easy for the rest of today and you should NOT DRIVE or use heavy machinery until tomorrow (because of the sedation medicines used during the test).    FOLLOW UP: Our staff will call the number listed on your records 48-72 hours following your procedure to check on you and address any questions or concerns that you may have regarding the information given to you following your procedure. If we do not reach you, we will leave a message.  We will attempt to reach you two times.  During this call, we will ask if you have developed any symptoms of COVID 19. If you develop any symptoms (ie: fever, flu-like symptoms, shortness of breath, cough etc.) before then, please call [phone].  If you test positive for Covid 19 in the 2 weeks post procedure, please call and report this information to [place].    If any biopsies were taken you will be contacted by phone or by letter within the next 1-3 weeks.  Please call us at [phone] if you have not heard about the biopsies in 3 weeks.    SIGNATURES/CONFIDENTIALITY: You and/or your care partner have signed paperwork which will be entered into your electronic medical record.  These signatures attest to the fact that that the information above on your After Visit Summary has been reviewed and is understood.  Full responsibility of the confidentiality of this discharge information lies with you and/or your care-partner.

## 2021-08-06 ENCOUNTER — Telehealth: Payer: Self-pay

## 2021-08-06 NOTE — Telephone Encounter (Signed)
°  Follow up Call-  Call back number 08/02/2021  Post procedure Call Back phone  # [phone]  Permission to leave phone message Yes  Some recent data might be hidden     Patient questions:  Do you have a fever, pain , or abdominal swelling? No. Pain Score  0 *  Have you tolerated food without any problems? Yes.    Have you been able to return to your normal activities? Yes.    Do you have any questions about your discharge instructions: Diet   No. Medications  No. Follow up visit  No.  Do you have questions or concerns about your Care? No.  Actions: * If pain score is 4 or above: No action needed, pain <4.

## 2021-08-21 ENCOUNTER — Encounter: Payer: Self-pay | Admitting: Gastroenterology

## 2021-08-21 ENCOUNTER — Telehealth: Payer: Self-pay | Admitting: Gastroenterology

## 2021-08-21 NOTE — Telephone Encounter (Signed)
Patient had colonoscopy on 12/15 and biopsies were taken.  She has still not heard back about the results.  Can you please call and share with patient.  She also states she is still having the same issues with diarrhea as before.  Thank you.

## 2021-08-21 NOTE — Telephone Encounter (Signed)
Pt calling for colonoscopy results. [place] pt but procedure done by Dr. Fuller Plan. Please advise.

## 2021-08-22 ENCOUNTER — Other Ambulatory Visit: Payer: Self-pay

## 2021-08-22 ENCOUNTER — Telehealth: Payer: Self-pay

## 2021-08-22 DIAGNOSIS — R197 Diarrhea, unspecified: Secondary | ICD-10-CM

## 2021-08-22 MED ORDER — BISMUTH SUBSALICYLATE 262 MG PO CHEW: 262.0000 mg | CHEWABLE_TABLET | ORAL | 0 refills | Status: AC | PRN

## 2021-08-22 MED ORDER — LOPERAMIDE HCL 2 MG PO TABS: 2.0000 mg | ORAL_TABLET | ORAL | 0 refills | Status: DC | PRN

## 2021-08-22 MED ORDER — RIFAXIMIN 550 MG PO TABS: 550.0000 mg | ORAL_TABLET | Freq: Three times a day (TID) | ORAL | 0 refills | Status: DC

## 2021-08-22 NOTE — Telephone Encounter (Signed)
APPROVAL  Medication: [employer]: [facility] PA response: APPROVED Approval dates: 08/22/21 through 09/05/21 Misc. Notes: Olivia Collier (Key: [id number])  This request has received a Favorable outcome.  Please note any additional information provided by [facility] at the bottom of your screen. Olivia Collier (Key: [id number]) Xifaxan 550MG  tablets   Form [facility] Medicare Part D Electronic Prior Authorization Form (2017 NCPDP) Created 1 hour ago Sent to Plan 1 hour ago Plan Response 1 hour ago Submit Clinical Questions 1 hour ago Determination Favorable 23 minutes ago Message from Plan Request Reference Number: [id number]. XIFAXAN TAB 550MG  is approved through 09/05/2021. Your patient may now fill this prescription and it will be covered.

## 2021-08-22 NOTE — Telephone Encounter (Signed)
Went over biopsy results with pt. Pt verbalized understanding and stated she continues to have diarrhea 5-7 times per day. Pt states diarrhea seems to get worse with stress. Pt wanted to know what medication can she take for the diarrhea.

## 2021-08-22 NOTE — Telephone Encounter (Signed)
Rx for Xifaxan sent to pt preferred pharmacy. PA initiated. Awaiting response of PA BEFORE scheduling f/u appt. If denied, may need alternative treatment or samples. Will continue efforts to follow up of status of PA.

## 2021-08-22 NOTE — Telephone Encounter (Signed)
PRIOR AUTHORIZATION  PA initiation date: 08/22/21  Medication: [place]: [facility] Submission completed electronically through [employer] My Meds: Yes  Will await insurance response re: approval/denial.  Olivia Collier (Key: [place])  Your information has been sent to [facility]. Olivia Collier (Key: [id number]) Xifaxan 550MG  tablets   Form [facility] Medicare Part D Electronic Prior Authorization Form (2017 NCPDP) Created 3 minutes ago Sent to Plan 2 minutes ago Plan Response 1 minute ago Submit Clinical Questions 1 minute ago Determination Wait for Determination Please wait for [facility] Medicare 2017 NCPDP to return a determination.

## 2021-08-22 NOTE — Telephone Encounter (Signed)
Called pt to inquire further about her symptoms. States it is normal for her to have up to 8 loose stools per day when she isn't taking Pepto Bismol and Imodium. States she does wish to rely nor become dependent upon these medications. Admits she will only take these medications when she is not at home. Asked if she noticed any relief after taking tinidazole. States she noticed relief for 3 days, then her loose stools returned. Denies having any abd pain but does have a significant amount of bloating and gas. Also states her stools do have a foul odor and are yellow in color. MAR updated to reflect all medications she is currently taking. Last GI profile obtained 12/5 and was unremarkable. LOV 06/26/21 with Dr. Tarri Glenn. Routing this message to Dr. Tarri Glenn to determine if she would like to repeat stool studies or if she would like another trial of tinidazole. Will await her orders.

## 2021-08-22 NOTE — Telephone Encounter (Signed)
Xifaxan is approved. Uncertain of pt out of pocket expense for medication. Called pt to inform her about Dr. Tarri Glenn recommendation, PA response and to advise her to f/u with her pharmacy re: her out of pocket expense. LVM requesting returned call. Pt will still require office f/u as well.

## 2021-08-23 NOTE — Telephone Encounter (Signed)
Called pt and informed her about new Rx, completed and approved PA and need to schedule f/u after she has started/completed tx't. States she has received a call from her pharmacy. They are processing the Rx. She will call to let me know if there is a financial burden with purchasing Rx. If so, will either obtain samples if able or change to an alternative Rx. F/u remains pending when she can start tx't. Will await pt returned call.

## 2021-08-24 NOTE — Telephone Encounter (Signed)
Inbound call from patient states price for prescription is $700 and patient can not afford it. Is asking about samples

## 2021-08-24 NOTE — Telephone Encounter (Signed)
Spoke with Julieanne Cotton, CMA to determine if samples are available. Confirmed we do have samples and they have been placed at the 3rd floor front desk for pt pick up. Called pt to make her aware. Pt also scheduled for f/u with Carl Best, NP on 09/17/21.

## 2021-09-16 NOTE — Progress Notes (Signed)
09/16/2021 Olivia Collier [id number] 1956/01/24   Chief Complaint: Diarrhea follow up  History of Present Illness: Olivia Collier. Olivia Collier is a 66 year old female with a past medical history of IDA, chronic diarrhea followed by Dr. Tarri Glenn.  She presents today for diarrhea follow-up.  As previously reviewed, she underwent stool studies and Giardia was detected 02-Aug-2021 which was treated with Tinidazole 2gm x 1. CRP < 1. Fecal calprotectin level < 16 and GI pathogen panel negative 07/2021. TSH 2.25. Celiac serology was negative in 2016.  She underwent a colonoscopy 08/02/2021 without evidence of colitis.  She was prescribed Xifaxan 550 mg 1 p.o. 3 times daily for 14 days around 08/21/2021 for possible small intestinal bacterial overgrowth.  She stated her diarrhea significantly improved by the ninth day however, several days after she completed the Xifaxan her diarrhea recurred.  She thinks her diarrhea is related to her elevated stress level.  She stated having the same diarrhea many years ago when she was going through divorce process.  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  She passes 6-7 nonbloody watery bowel movements on days when she is stressed in 3-4 would like episodes of diarrhea when her stress level is lower.  She takes Imodium 2 tabs every 4 hours the day before she has a planned day away from home which results in passing to solid stools on that day which are well controlled.  She sometimes takes Pepto-Bismol instead of Imodium.  She remains on Metamucil 2 capsules daily.  Her dairy intake is limited.  No specific food triggers.  She had mild central lower abdominal cramping yesterday, no significant abdominal pain.  Colonoscopy 08/02/2021: - The examined portion of the ileum was normal. - Internal hemorrhoids. - The examination was otherwise normal on direct and retroflexion views. Random biopsies. - No specimens  collected. - Recall colonoscopy 5 years Surgical [P], random colon biopsy - COLONIC MUCOSA WITH NO SPECIFIC HISTOPATHOLOGIC CHANGES - NEGATIVE FOR ACUTE INFLAMMATION, INCREASED INTRAEPITHELIAL LYMPHOCYTES OR THICKENED SUBEPITHELIAL COLLAGEN TABLE   CBC Latest Ref Rng & Units 06/26/2021 09/02/2014 10/05/2009  WBC 4.0 - 10.5 K/uL 8.8 7.6 14.7(H)  Hemoglobin 12.0 - 15.0 g/dL 14.1 11.3(L) 14.8  Hematocrit 36.0 - 46.0 % 42.8 35.7(L) 45.2  Platelets 150.0 - 400.0 K/uL 224.0 188.0 197   Current Outpatient Medications on File Prior to Visit  Medication Sig Dispense Refill   bisoprolol-hydrochlorothiazide (ZIAC) 10-6.25 MG tablet Take 1 tablet by mouth daily.     Calcium-Vitamin D-Vitamin K (CALCIUM + D + K PO) Take by mouth daily.     cetirizine-pseudoephedrine (ZYRTEC-D) 5-120 MG per tablet Take 1 tablet by mouth 2 (two) times daily.     ECHINACEA HERB PO Take 1 tablet by mouth daily.     FLUoxetine (PROZAC) 20 MG capsule Take 20 mg by mouth 2 (two) times daily.     loperamide (IMODIUM A-D) 2 MG tablet Take 1 tablet (2 mg total) by mouth as needed for diarrhea or loose stools. 30 tablet 0   Multiple Vitamins-Minerals (ICAPS AREDS 2 PO) Take by mouth.     OVER THE COUNTER MEDICATION Focus Factor daily     pseudoephedrine-acetaminophen (TYLENOL SINUS) 30-500 MG TABS Take 1 tablet by mouth every 4 (four) hours as needed.     rifaximin (XIFAXAN) 550 MG TABS tablet Take 1 tablet (550 mg total) by mouth 3 (three) times daily. 42 tablet 0   traMADol (  ULTRAM) 50 MG tablet Take 1 tablet (50 mg total) by mouth as needed. 30 tablet    No current facility-administered medications on file prior to visit.   Allergies  Allergen Reactions   Latex Anaphylaxis    Hives and sinusitis    Current Medications, Allergies, Past Medical History, Past Surgical History, Family History and Social History were reviewed in [employer] record.  Review of Systems:   Constitutional: Negative for  fever, sweats, chills or weight loss.  Respiratory: Negative for shortness of breath.   Cardiovascular: Negative for chest pain, palpitations and leg swelling.  Gastrointestinal: See HPI.  Musculoskeletal: Negative for back pain or muscle aches.  Neurological: Negative for dizziness, headaches or paresthesias.   Physical Exam: BP 104/68 (BP Location: Left Arm, Patient Position: Sitting, Cuff Size: Normal)    Pulse 84    Ht 5' 8.75" (1.746 m) Comment: height measured without shoes   Wt 223 lb 8 oz (101.4 kg)    BMI 33.25 kg/m   General: 66 year old female in no acute distress. Head: Normocephalic and atraumatic. Eyes: No scleral icterus. Conjunctiva pink . Lungs: Clear throughout to auscultation. Heart: Regular rate and rhythm, no murmur. Abdomen: Soft, nontender and nondistended. No masses or hepatomegaly. Normal bowel sounds x 4 quadrants.  Rectal: Deferred. Musculoskeletal: Symmetrical with no gross deformities. Extremities: No edema. Neurological: Alert oriented x 4. No focal deficits.  Psychological: Alert and cooperative. Normal mood and affect  Assessment and Recommendations:  55) 66 year old female with IBS - D. Treated with Xifaxan 550mg  one po tid x 14 days, diarrhea significantly improved by the 9th day then recurred within one week after completing the course of Xifaxan.  Colonoscopy 08/02/2021 without evidence of microscopic colitis or IBD.  -Florastor probiotic 1 p.o. twice daily for 2 to 4 weeks or any less expensive bacteria probiotic of choice for 2 to 4 weeks -Dicyclomine 10 mg 1 p.o. 3 times daily as needed for nervous intestine and abdominal cramp -Consider repeat course of Xifaxan if diarrhea persists/worsens  2) Colon cancer screening.  No colon polyps per colonoscopy 08/02/2021. Family history of colon polyps (sister and brother) and colon cancer (father). -Recall colonoscopy 07/2026

## 2021-09-17 ENCOUNTER — Ambulatory Visit: Payer: Medicare Other | Admitting: Nurse Practitioner

## 2021-09-17 ENCOUNTER — Encounter: Payer: Self-pay | Admitting: Nurse Practitioner

## 2021-09-17 VITALS — BP 104/68 | HR 84 | Ht 68.75 in | Wt 223.5 lb

## 2021-09-17 DIAGNOSIS — K580 Irritable bowel syndrome with diarrhea: Secondary | ICD-10-CM

## 2021-09-17 DIAGNOSIS — K589 Irritable bowel syndrome without diarrhea: Secondary | ICD-10-CM | POA: Insufficient documentation

## 2021-09-17 MED ORDER — DICYCLOMINE HCL 10 MG PO CAPS: 10.0000 mg | ORAL_CAPSULE | Freq: Three times a day (TID) | ORAL | 1 refills | Status: DC | PRN

## 2021-09-17 NOTE — Patient Instructions (Signed)
MEDICATION: We have sent the following medication to your pharmacy for you to pick up at your convenience: Dicyclomine 10 MG 1 capsule up to three times a day if needed for nervous intestine and cramping.  RECOMMENDATIONS:  Florastor Probiotic 1 capsule twice a day for the next 2-4 weeks, or any other less expensive bacteria probiotic.  It was great seeing you today! Thank you for entrusting me with your care and choosing [facility]  Noralyn Pick, CRNP  The [facility] providers would like to encourage you to use [facility] to communicate with providers for non-urgent requests or questions.  Due to long hold times on the telephone, sending your provider a message by [facility] may be faster and more efficient way to get a response. Please allow 48 business hours for a response.  Please remember that this is for non-urgent requests/questions.  If you are age 74 or older, your body mass index should be between 23-30. Your Body mass index is 33.25 kg/m. If this is out of the aforementioned range listed, please consider follow up with your Primary Care Provider.  If you are age 54 or younger, your body mass index should be between 19-25. Your Body mass index is 33.25 kg/m. If this is out of the aformentioned range listed, please consider follow up with your Primary Care Provider.

## 2021-09-19 NOTE — Progress Notes (Signed)
Reviewed and agree with management plans. If diarrhea persists, would recommend SIBO testing prior to a retreatment trial to determine if methane-predominant SIBO is present to allow for appropriately tailored therapy.   Fenna Semel L. Tarri Glenn, MD, MPH

## 2021-09-28 DIAGNOSIS — K900 Celiac disease: Secondary | ICD-10-CM | POA: Diagnosis not present

## 2021-09-28 DIAGNOSIS — J329 Chronic sinusitis, unspecified: Secondary | ICD-10-CM | POA: Diagnosis not present

## 2021-11-28 DIAGNOSIS — H353132 Nonexudative age-related macular degeneration, bilateral, intermediate dry stage: Secondary | ICD-10-CM | POA: Diagnosis not present

## 2021-12-02 IMAGING — MG MM DIGITAL SCREENING BILAT W/ TOMO AND CAD
8 series · 9 of 24 positions shown · non-contrast
Comparison: Previous exam(s).

CLINICAL DATA: Screening.

EXAM:
DIGITAL SCREENING BILATERAL MAMMOGRAM WITH TOMOSYNTHESIS AND CAD
TECHNIQUE: Bilateral screening digital craniocaudal and mediolateral oblique
mammograms were obtained. Bilateral screening digital breast
tomosynthesis was performed. The images were evaluated with
computer-aided detection.

[R CC synth-2D]
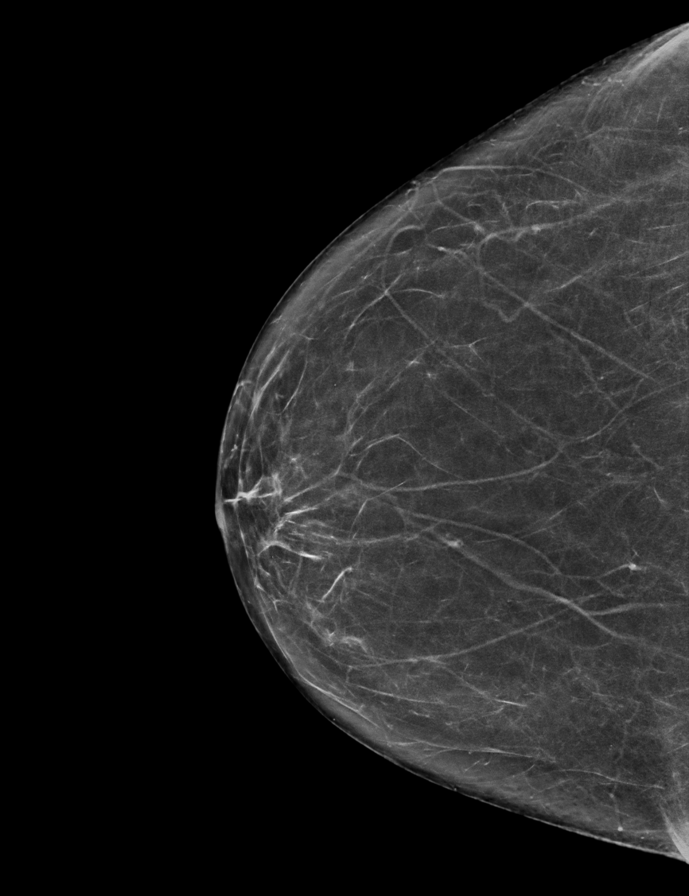

[L MLO synth-2D]
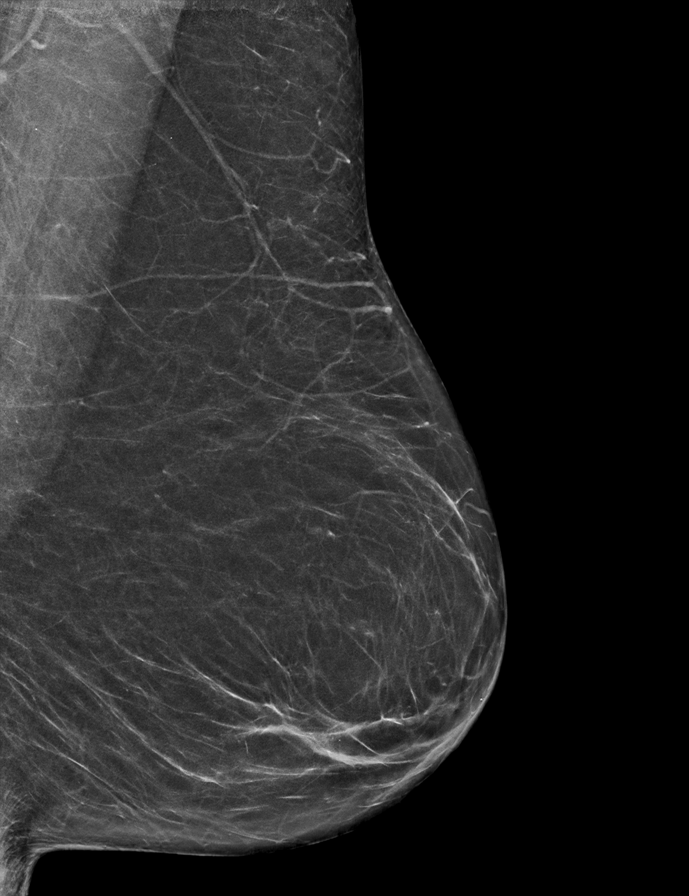

[R MLO synth-2D]
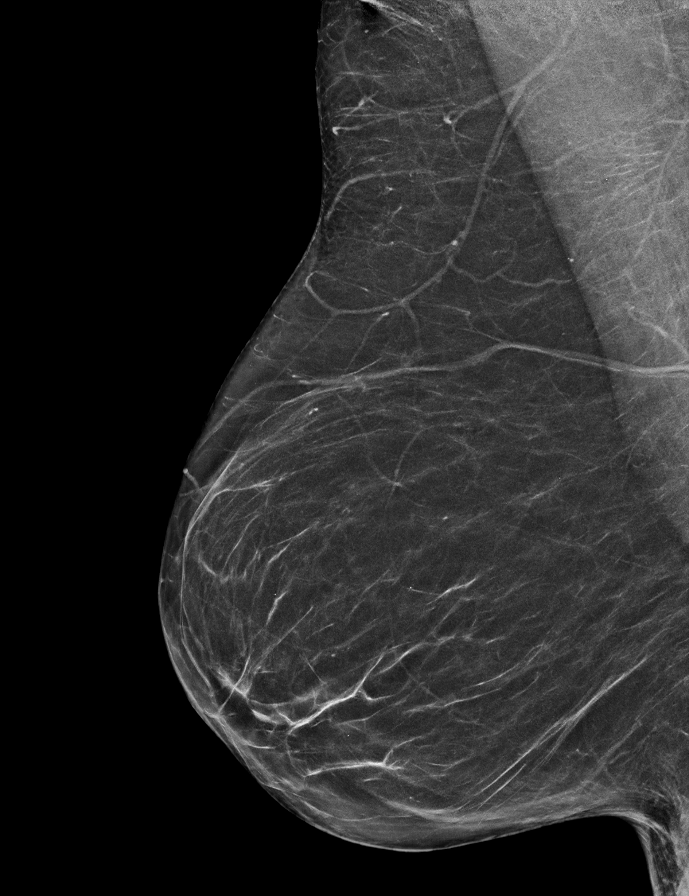

[L CC synth-2D]
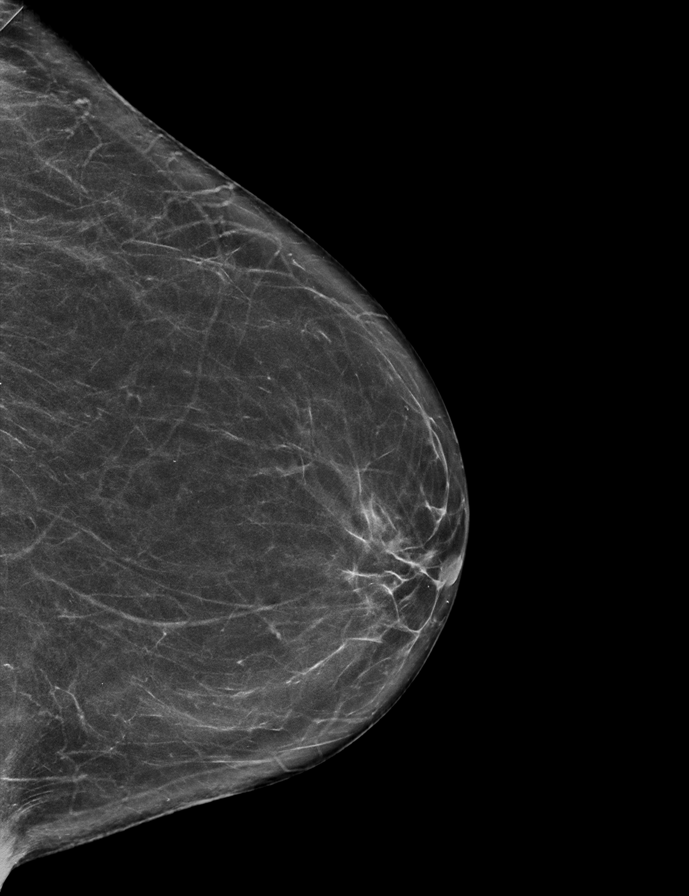

[R MLO tomo · 2 of 63 frames shown]
[frame 21/63]
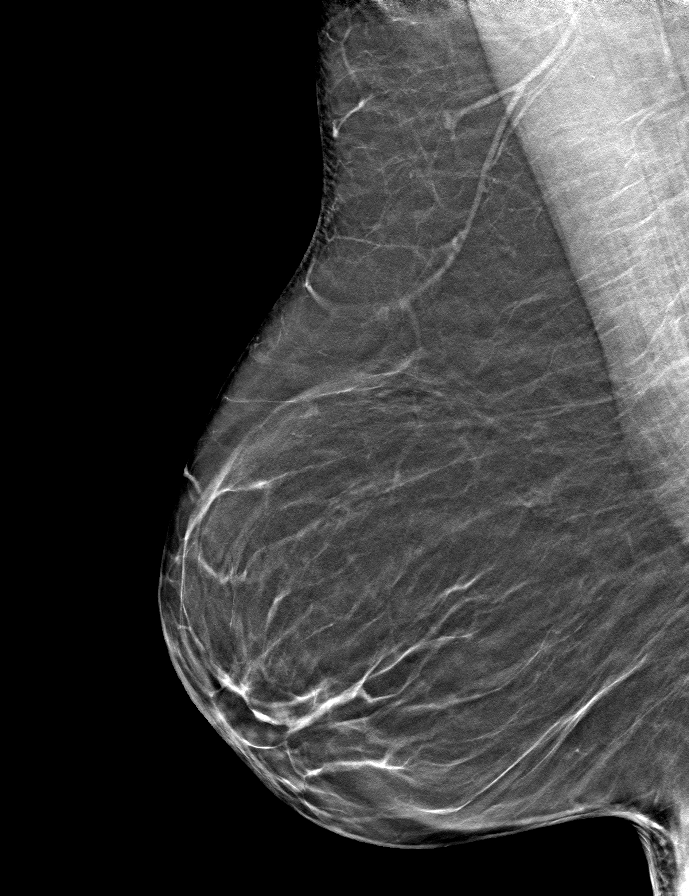
[frame 32/63]
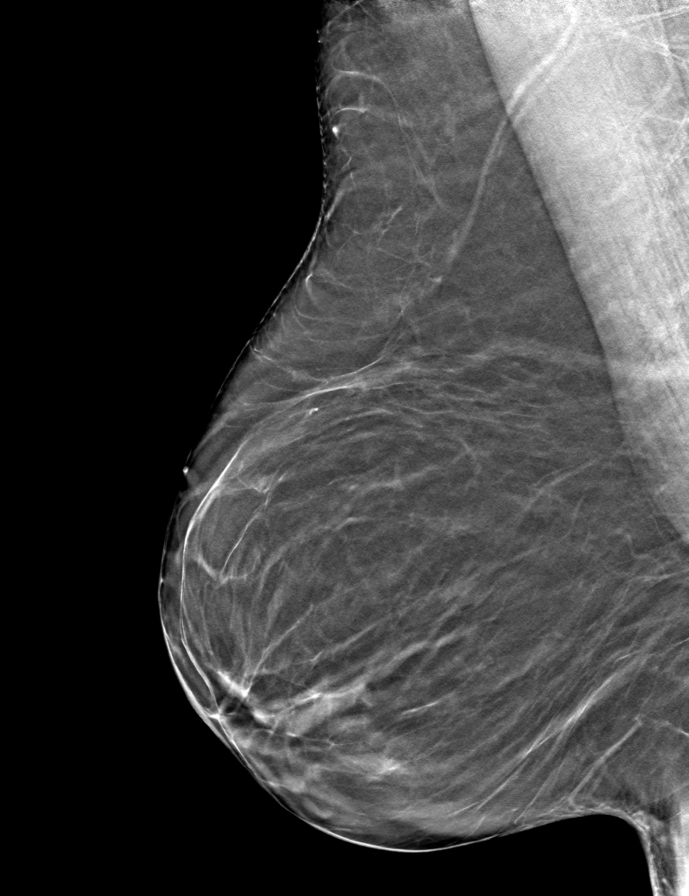

[L CC tomo · tomo slice 33/65.0]
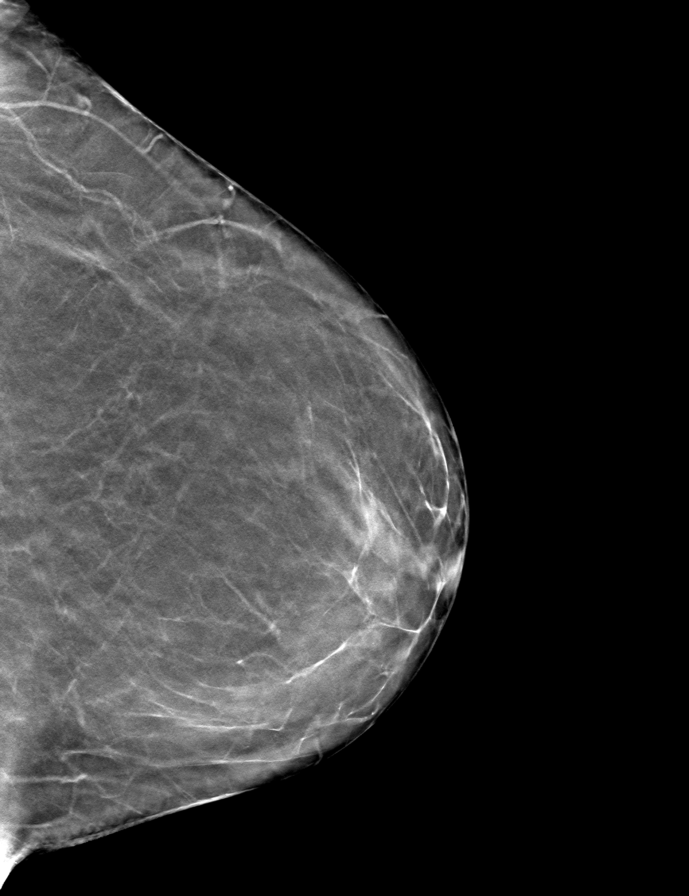

[L MLO tomo · tomo slice 31/61.0]
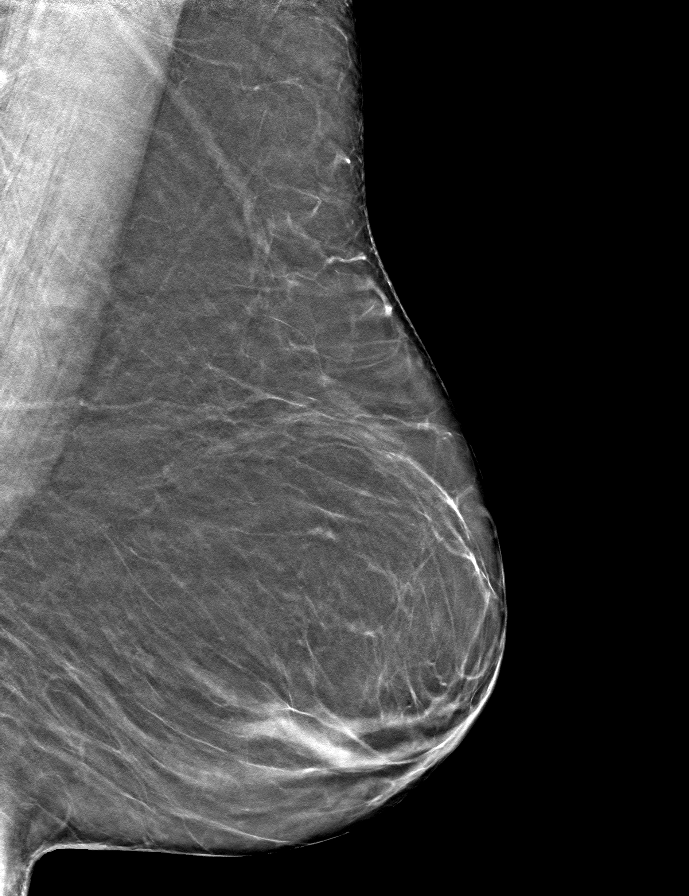

[R CC tomo · tomo slice 33/65.0]
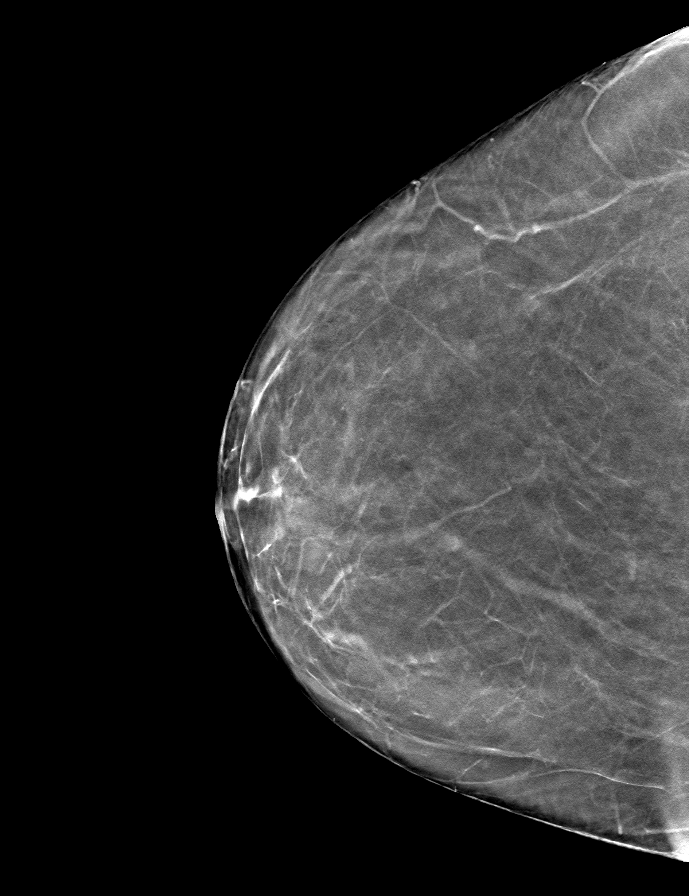

[9 of 24 positions shown; findings below may reference images not displayed]

ACR Breast Density Category b: There are scattered areas of
fibroglandular density.
FINDINGS: There are no findings suspicious for malignancy.
IMPRESSION: No mammographic evidence of malignancy. A result letter of this
screening mammogram will be mailed directly to the patient.

RECOMMENDATION:
Screening mammogram in one year. (Code:51-O-LD2)

BI-RADS CATEGORY  1: Negative.

## 2022-01-02 ENCOUNTER — Other Ambulatory Visit: Payer: Medicare Other

## 2022-01-02 ENCOUNTER — Ambulatory Visit: Payer: Medicare Other | Admitting: Gastroenterology

## 2022-01-02 ENCOUNTER — Encounter: Payer: Self-pay | Admitting: Gastroenterology

## 2022-01-02 VITALS — BP 112/72 | HR 87 | Ht 68.75 in | Wt 226.5 lb

## 2022-01-02 DIAGNOSIS — K529 Noninfective gastroenteritis and colitis, unspecified: Secondary | ICD-10-CM

## 2022-01-02 NOTE — Patient Instructions (Addendum)
It was a pleasure to see you today.  I am so glad to hear your diarrhea is better.  Congratulations on figuring out dietary changes to control your symptoms.   ? ?I have recommended a blood test for Alpha Gal. ? ?We will refer you to [place] for further guidance around a plant based diet.  ? ?Please let me know if your diarrhea returns as we would do testing for bacterial overgrowth. ? ?You should plan to have another colonoscopy in 2027 given your family history of colon cancer and polyps. ? ?Please let me know if you need anything prior to that time. ?

## 2022-01-02 NOTE — Progress Notes (Addendum)
? ?Referring Provider: Sharilyn Sites, MD ?Primary Care Physician:  Sharilyn Sites, MD ? ? ?Chief Complaint: Chronic diarrhea ? ? ?IMPRESSION:  ?Chronic diarrhea. Fecal calprotectin, ESR, and CRP were normal. Giardia testing was positive, but treatment did not change her symptoms. Previously testing for celiac was negative. Colonoscopy with random biopsies was normal.  Temporary resolution of symptoms with Xifaxan 550 mg TID x 14 days.  Symptoms now controlled with switch to plant-based foods and gluten-free bread suggesting a likely diagnosis of carbohydrate intolerance. However, red meats have been identified as a possible food trigger.  We will continue dietary changes.  Consider breath test for SIBO if diarrhea returns. ? ?History of Giardia. Treated 2022.  ? ?Family history of colon cancer and polyps: Last colonoscopy 08/02/21. Due surveillance colonoscopy 2027.  ? ?History of iron deficiency. Last labs show improvement ? ? ?PLAN: ?- Continue current dietary changes as diarrhea is well controlled ?- Alpha Gal testing ?- Referral to [place] KD:XIPJA based diet with attention to avoiding nutritional deficiencies ?- SIBO breath test if diarrhea returns ?- Office follow-up as needed ?- Surveillance colonoscopy 2027 ? ?I spent 45 minutes, including in depth chart review, independent review of results, communicating results with the patient directly, face-to-face time with the patient, coordinating care, ordering studies and medications as appropriate, and documentation.   ? ?HPI: Olivia Collier is a 66 y.o. female who returns in follow-up of diarrhea. She was last seen by me in consultation 06/26/21 and by Carl Best 09/17/21. The interval history is obtained to the patient and review of her electronic health record. She has a history of depression and hypertension. ? ?Previously followed by Dr. Maurene Capes for IBS with bloating, fecal urgency, and loose stools.  Also has a history of recurrent  colonic infections treated with metronidazole over the years. Notes history of IBS started during a divorce. Last seen in 2016.  ? ?06/26/21 consultation: Presents today reports years of watery, explosive diarrhea with associated fecal incontinence. Two bowel movements every morning that occur approximately 1 hour after eating. Associated bloating. There is no nausea, vomiting, or abdominal pain. Appetite is good. Weight is stable. No blood or mucous in the stool. Recent antibiotics for sinus infection.  Denies any other precipitating event, trauma, close contacts with similar symptoms, recent travel. Spicy foods trigger her symptoms.  Temporal association with stress around death of family members and stress managing the estate. Weight gain since retiring January 2022 after 40 years at [employer] daily for many years. Will use imodium PRN with success.  ? ?06/27/21 stool positive for Giardia. Treated with tinidazole 2g.  ? ?08/02/2021 colonoscopy with Dr. Fuller Plan was normal except for grade 1 internal hemorrhoids.  Random colon biopsies were normal. ? ?09/17/2021 office follow-up with Carl Best she reported improvement with Xifaxan 550 mg 3 times daily for 14 days but return of symptoms after completing antibiotics.  She attributed some of her diarrhea to stress. She was treated with Florastor probiotic and dicyclomine. ? ?01/02/22 returns in scheduled follow-up. No significant change with using dicyclomine. Diarrhea has resolved with changing her diet to gluten free bread and plant based foods using the low FODMAP diet as a guideline. She identified red meat as a trigger and wonders about AlphaGal. She has had multiple tick bites. She has now having 2 formed bowel movements daily. She is pleased enough that she considered not coming to this appointment.  No blood or mucous in the stool. No new GI  complaints. She is taking Vitamin B12 supplements.  ? ? ?Prior testing: ?- ferritin 5, iron 27  2016 ?- celiac serologies including gliadin antibodies and TTGA 2016 ?- Fecal calprotectin normal 06/27/21 ?- Giardia positive 06/27/21 ?- Ferritin 44.8, iron 49 06/26/21 ?- Normal CBC 06/26/21 ?- CRP and ESR normal 06/26/21 ?- TSH normal 06/26/21 ?- GI pathogen panel negative 07/23/21 ? ?Prior abdominal imaging: ?- Abdominal ultrasound in 2016 for abdominal pain that was normal. ? ?Most recent labs available to me show a CBC from 2019 with a hemoglobin of 12.3, MCV 96.4, RDW 12.9, platelets 165 ? ?Endoscopic history: ?She has had 3 prior normal colonoscopies in 1996, 2008, and 2014.   ?EGD in 1994 showed mild gastritis and duodenitis. Esophageal biopsies showed esophagitis. ? ?Father had colon cancer at age 33.  Two brothers and one sister had colon polyps. ? ?Past Medical History:  ?Diagnosis Date  ? Allergy   ? Anemia   ? Asthma   ? Cancer [facility])   ? cervical cancer age 4- no chemo, no radiation  ? Cataract   ? forming  ? Depression   ? History of giardia infection 06/26/2021  ? treated by [facility]  ? Hyperlipidemia   ? Hypertension   ? Macular degeneration   ? Obesity   ? Osteoporosis   ? past hx  ? Ulcerative colitis ([place])   ? past hx  ? ? ?Past Surgical History:  ?Procedure Laterality Date  ? CERVICAL CONE BIOPSY    ? CERVICAL FUSION  08/19/2004  ? COLONOSCOPY    ? ?Current Outpatient Medications  ?Medication Sig Dispense Refill  ? bisoprolol-hydrochlorothiazide (ZIAC) 10-6.25 MG tablet Take 1 tablet by mouth daily.    ? Calcium-Vitamin D-Vitamin K (CALCIUM + D + K PO) Take by mouth daily.    ? cetirizine-pseudoephedrine (ZYRTEC-D) 5-120 MG per tablet Take 1 tablet by mouth 2 (two) times daily.    ? dicyclomine (BENTYL) 10 MG capsule Take 1 capsule (10 mg total) by mouth 3 (three) times daily as needed for spasms (abdominal pain). 30 capsule 1  ? ECHINACEA HERB PO Take 1 tablet by mouth daily.    ? FLUoxetine (PROZAC) 20 MG capsule Take 20 mg by mouth 2 (two) times daily.    ? loperamide (IMODIUM A-D) 2 MG tablet Take 1  tablet (2 mg total) by mouth as needed for diarrhea or loose stools. 30 tablet 0  ? Multiple Vitamins-Minerals (ICAPS AREDS 2 PO) Take by mouth.    ? OVER THE COUNTER MEDICATION Focus Factor daily    ? pseudoephedrine-acetaminophen (TYLENOL SINUS) 30-500 MG TABS Take 1 tablet by mouth every 4 (four) hours as needed.    ? rifaximin (XIFAXAN) 550 MG TABS tablet Take 1 tablet (550 mg total) by mouth 3 (three) times daily. 42 tablet 0  ? traMADol (ULTRAM) 50 MG tablet Take 1 tablet (50 mg total) by mouth as needed. 30 tablet   ? ?No current facility-administered medications for this visit.  ? ? ?Allergies as of 01/02/2022 - Review Complete 09/17/2021  ?Allergen Reaction Noted  ? Latex Anaphylaxis 09/25/2012  ? ? ?Family History  ?Problem Relation Age of Onset  ? Colon cancer Father   ? Breast cancer Sister 23  ? Colon polyps Sister   ? Colon polyps Sister   ? Colon polyps Brother   ? Colon polyps Brother   ? Esophageal cancer Neg Hx   ? Rectal cancer Neg Hx   ? Stomach cancer Neg Hx   ? ? ? ?  Physical Exam: ?General:   Alert,  well-nourished, pleasant and cooperative in NAD ?Head:  Normocephalic and atraumatic. ?Eyes:  Sclera clear, no icterus.   Conjunctiva pink. ?Abdomen:  Soft, nontender, nondistended, normal bowel sounds, no rebound or guarding. No hepatosplenomegaly.   ?Neurologic:  Alert and  oriented x4;  grossly nonfocal ?Skin:  Intact without significant lesions or rashes. ?Psych:  Alert and cooperative. Normal mood and affect. ? ?I spent over 30 minutes, including in depth chart review, independent review of results, face-to-face time with the patient, coordinating care, and ordering studies and medications as appropriate, and documentation.  ? ?Olivia Ellerman L. Tarri Glenn, MD, MPH ?01/02/2022, 8:26 AM ? ? ? ?  ?

## 2022-01-07 LAB — ALPHA-GAL PANEL
Allergen, Mutton, f88: <0.1 kU/L
Allergen, Pork, f26: <0.1 kU/L
Beef: <0.1 kU/L
CLASS: 0
CLASS: 0
Class: 0
GALACTOSE-ALPHA-1,3-GALACTOSE IGE*: <0.1 kU/L (ref <0.10)

## 2022-01-07 LAB — INTERPRETATION:

## 2022-02-14 ENCOUNTER — Encounter: Payer: Medicare Other | Attending: Gastroenterology | Admitting: Nutrition

## 2022-02-14 DIAGNOSIS — K582 Mixed irritable bowel syndrome: Secondary | ICD-10-CM | POA: Insufficient documentation

## 2022-02-14 DIAGNOSIS — K529 Noninfective gastroenteritis and colitis, unspecified: Secondary | ICD-10-CM

## 2022-02-14 NOTE — Progress Notes (Signed)
Medical Nutrition Therapy  Appointment Start time:  1300  Appointment End time:  1400  Primary concerns today: Chronic diarrhea and weight gain  Referral diagnosis: K58 Preferred learning style: no preference Learning readiness: ready    NUTRITION ASSESSMENT  79 y old wfemale referred for IBS. Joined some FB group with IBS. Has been following FODMAP that her GI doctor told her about.. Has gotten rid of red meat. Gets diarhea with red meat.  No pork. Eggs give diarrhea. Has stopped diary. Has latax allergies.   Taking a  probiotic.  She has begun to feel better somewhat since cutting out those foods. Sees Dr. Modena Nunnery is GI in [place]. Family history of heart disease, HTN, Obesity,   She wants to know more about the FODMAP diet an other foods to avoid to help her IBS.  She is willing to focus on the FODMAP diet for 2 weeks and slowing transition to lifestyle medicine with tolerated foods.   Anthropometrics  Wt Readings from Last 3 Encounters:  03/26/22 223 lb (101.2 kg)  01/02/22 226 lb 8 oz (102.7 kg)  09/17/21 223 lb 8 oz (101.4 kg)   Ht Readings from Last 3 Encounters:  03/26/22 '5\' 9"'$  (1.753 m)  01/02/22 5' 8.75" (1.746 m)  09/17/21 5' 8.75" (1.746 m)   There is no height or weight on file to calculate BMI. '@BMIFA'$ @ Facility age limit for growth %iles is 20 years. Facility age limit for growth %iles is 20 years.    Clinical Medical Hx: see chart Medications: see chart Labs: See chart Notable Signs/Symptoms: bloating, stomach pains, diarrhea/constipation,  Lifestyle & Dietary Hx Lives by herself. Cooking meals at home.  Estimated daily fluid intake: 40 oz Supplements:  Sleep: varies Stress / self-care: her health Current average weekly physical activity: walks  24-Hr Dietary Recall First Meal: Oatmeal with dates and walnuts. Snack:  Second Meal: Bean and rice, diet soda Snack:  Third Meal: skipped. Snack:  Beverages: water and diet sodas  Estimated Energy  Needs Calories: 1200 Carbohydrate: 135g Protein: 90g Fat: 33g   NUTRITION DIAGNOSIS  NB-1.1 Food and nutrition-related knowledge deficit As related to Intolerance to certain foods.  As evidenced by IBS.   NUTRITION INTERVENTION  Nutrition education (E-1) on the following topics:  FODMAP diet  Handouts Provided Include  FODMAP diet handout  Learning Style & Readiness for Change Teaching method utilized: Visual & Auditory  Demonstrated degree of understanding via: Teach Back  Barriers to learning/adherence to lifestyle change: none  Goals Established by Pt  Eat three meals per day Focus on more plant based foods. Walk 30 minutes a day Cut out diet sodas Lose 1 lb per week.   MONITORING & EVALUATION Dietary intake, weekly physical activity, and GI symptoms in 1 month.  Next Steps  Patient is to work on following FODMAP diet.Marland Kitchen

## 2022-02-14 NOTE — Patient Instructions (Signed)
Goals   Eat three meals per day Focus on  more plant based foods. Walk 30 minutes a day Cut out diet sodas Lose 1 lb per week.

## 2022-02-22 DIAGNOSIS — J329 Chronic sinusitis, unspecified: Secondary | ICD-10-CM | POA: Diagnosis not present

## 2022-03-26 ENCOUNTER — Encounter: Payer: Medicare Other | Attending: Gastroenterology | Admitting: Nutrition

## 2022-03-26 VITALS — Ht 69.0 in | Wt 223.0 lb

## 2022-03-26 DIAGNOSIS — E66812 Obesity, class 2: Secondary | ICD-10-CM

## 2022-03-26 DIAGNOSIS — K582 Mixed irritable bowel syndrome: Secondary | ICD-10-CM

## 2022-03-26 DIAGNOSIS — R197 Diarrhea, unspecified: Secondary | ICD-10-CM

## 2022-03-26 NOTE — Progress Notes (Unsigned)
Medical Nutrition Therapy  Appointment Start time:  7544  Appointment End time:  1120  Primary concerns today: Chronic diarrhea and weight gain  Referral diagnosis: K58, E66.9, R19.7 Preferred learning style: no preference Learning readiness: Ready    NUTRITION ASSESSMENT  Follow up:   She notes she feels a lot better.  Has been following the FODMAP diet. Eating more plant based foods. Feels 100% different. She is amazed at how much better she feels eating plant based and no animal meat. Lost 3 lbs.  Her clothes fit a lot better. No longer fatigued when mowing. Watched the move [employer] and this has helped her realize the benefits..  No longer having diarrhea, no longer cramping. Feels more secure going out in public and feels much better emotionally.  She has cut out eating out. Her skin is much better. Has been following vegan No soda or tea. Sees Dr. Modena Nunnery is GI n [place]. Family history of heart disease, HTN, Obesity,   Anthropometrics  Wt Readings from Last 3 Encounters:  03/26/22 223 lb (101.2 kg)  01/02/22 226 lb 8 oz (102.7 kg)  09/17/21 223 lb 8 oz (101.4 kg)   Ht Readings from Last 3 Encounters:  03/26/22 '5\' 9"'$  (1.753 m)  01/02/22 5' 8.75" (1.746 m)  09/17/21 5' 8.75" (1.746 m)   Body mass index is 32.93 kg/m. '@BMIFA'$ @ Facility age limit for growth %iles is 20 years. Facility age limit for growth %iles is 20 years.    Clinical Medical Hx: see chart Labs: none  Notable Signs/Symptoms: none  Lifestyle & Dietary Hx Lives by herself. Cooking meals at home.  Estimated daily fluid intake: 80 oz Supplements:  Sleep: good Stress / self-care: none Current average weekly physical activity: working out, walking  24-Hr Dietary Recall First Meal: Oatmeal with chia seeds and walnuts. Snack:  Second Meal: Potato salad with vegan mayo, pinto beans and salad, water Snack:  Third Meal: skipped- was tired and went to bed. Snack:  Beverages: water  Estimated  Energy Needs Calories: 1200 Carbohydrate: 135g Protein: 90g Fat: 33g   NUTRITION DIAGNOSIS  NB-1.1 Food and nutrition-related knowledge deficit As related to Obesity and GI issues.  As evidenced by BMI 32 and IBS.   NUTRITION INTERVENTION  Nutrition education (E-1) on the following topics:  Lifestyle Medicine  - Whole Food, Plant Predominant Nutrition is highly recommended: Eat Plenty of vegetables, Mushrooms, fruits, Legumes, Whole Grains, Nuts, seeds in lieu of processed meats, processed snacks/pastries red meat, poultry, eggs.    -It is better to avoid simple carbohydrates including: Cakes, Sweet Desserts, Ice Cream, Soda (diet and regular), Sweet Tea, Candies, Chips, Cookies, Store Bought Juices, Alcohol in Excess of  1-2 drinks a day, Lemonade,  Artificial Sweeteners, Doughnuts, Coffee Creamers, "Sugar-free" Products, etc, etc.  This is not a complete list.....  Exercise: If you are able: 30 -60 minutes a day ,4 days a week, or 150 minutes a week.  The longer the better.  Combine stretch, strength, and aerobic activities.  If you were told in the past that you have high risk for cardiovascular diseases, you may seek evaluation by your heart doctor prior to initiating moderate to intense exercise programs.   Handouts Provided Include  Lifestyle Medicine handouts FODMAP  Learning Style & Readiness for Change Teaching method utilized: Visual & Auditory  Demonstrated degree of understanding via: Teach Back  Barriers to learning/adherence to lifestyle change: none  Goals Established by Pt Exercise is be walking 30 minutes a day.  Check the [employer] for your insurance benefit for exercise Lose 2 lbs per month    MONITORING & EVALUATION Dietary intake, weekly physical activity in 3 months.  Next Steps  Patient is to keep eating plant based. Marland Kitchen

## 2022-03-26 NOTE — Patient Instructions (Signed)
Exercise is be walking 30 minutes a day. Check the [employer] for your insurance benefit for exercise Lose 2 lbs per month

## 2022-03-27 ENCOUNTER — Encounter: Payer: Self-pay | Admitting: Nutrition

## 2022-04-24 DIAGNOSIS — R7309 Other abnormal glucose: Secondary | ICD-10-CM | POA: Diagnosis not present

## 2022-04-24 DIAGNOSIS — E782 Mixed hyperlipidemia: Secondary | ICD-10-CM | POA: Diagnosis not present

## 2022-04-24 DIAGNOSIS — E7849 Other hyperlipidemia: Secondary | ICD-10-CM | POA: Diagnosis not present

## 2022-04-24 DIAGNOSIS — Z0001 Encounter for general adult medical examination with abnormal findings: Secondary | ICD-10-CM | POA: Diagnosis not present

## 2022-04-24 DIAGNOSIS — J302 Other seasonal allergic rhinitis: Secondary | ICD-10-CM | POA: Diagnosis not present

## 2022-04-24 DIAGNOSIS — J4520 Mild intermittent asthma, uncomplicated: Secondary | ICD-10-CM | POA: Diagnosis not present

## 2022-04-24 DIAGNOSIS — K900 Celiac disease: Secondary | ICD-10-CM | POA: Diagnosis not present

## 2022-04-24 DIAGNOSIS — E559 Vitamin D deficiency, unspecified: Secondary | ICD-10-CM | POA: Diagnosis not present

## 2022-04-24 DIAGNOSIS — I10 Essential (primary) hypertension: Secondary | ICD-10-CM | POA: Diagnosis not present

## 2022-05-06 ENCOUNTER — Other Ambulatory Visit: Payer: Self-pay | Admitting: Family Medicine

## 2022-05-06 DIAGNOSIS — Z1231 Encounter for screening mammogram for malignant neoplasm of breast: Secondary | ICD-10-CM

## 2022-05-28 ENCOUNTER — Ambulatory Visit
Admission: RE | Admit: 2022-05-28 | Discharge: 2022-05-28 | Disposition: A | Discharge status: Home / Self Care | Payer: Medicare Other | Source: Ambulatory Visit | Attending: Family Medicine | Admitting: Family Medicine

## 2022-05-28 DIAGNOSIS — Z1231 Encounter for screening mammogram for malignant neoplasm of breast: Secondary | ICD-10-CM

## 2022-07-23 ENCOUNTER — Ambulatory Visit: Payer: Medicare Other | Admitting: Nutrition

## 2022-08-01 DIAGNOSIS — H353132 Nonexudative age-related macular degeneration, bilateral, intermediate dry stage: Secondary | ICD-10-CM | POA: Diagnosis not present

## 2022-09-03 ENCOUNTER — Encounter: Payer: Self-pay | Admitting: Nutrition

## 2022-10-15 DIAGNOSIS — J329 Chronic sinusitis, unspecified: Secondary | ICD-10-CM | POA: Diagnosis not present

## 2022-10-15 DIAGNOSIS — R42 Dizziness and giddiness: Secondary | ICD-10-CM | POA: Diagnosis not present

## 2022-12-06 DIAGNOSIS — H8309 Labyrinthitis, unspecified ear: Secondary | ICD-10-CM | POA: Diagnosis not present

## 2022-12-06 DIAGNOSIS — J302 Other seasonal allergic rhinitis: Secondary | ICD-10-CM | POA: Diagnosis not present

## 2022-12-06 DIAGNOSIS — H66002 Acute suppurative otitis media without spontaneous rupture of ear drum, left ear: Secondary | ICD-10-CM | POA: Diagnosis not present

## 2022-12-06 DIAGNOSIS — J329 Chronic sinusitis, unspecified: Secondary | ICD-10-CM | POA: Diagnosis not present

## 2022-12-17 DIAGNOSIS — H8112 Benign paroxysmal vertigo, left ear: Secondary | ICD-10-CM | POA: Diagnosis not present

## 2022-12-17 DIAGNOSIS — H8111 Benign paroxysmal vertigo, right ear: Secondary | ICD-10-CM | POA: Diagnosis not present

## 2023-01-01 DIAGNOSIS — H8111 Benign paroxysmal vertigo, right ear: Secondary | ICD-10-CM | POA: Diagnosis not present

## 2023-02-10 DIAGNOSIS — L821 Other seborrheic keratosis: Secondary | ICD-10-CM | POA: Diagnosis not present

## 2023-02-10 DIAGNOSIS — Z7189 Other specified counseling: Secondary | ICD-10-CM | POA: Diagnosis not present

## 2023-02-10 DIAGNOSIS — D2362 Other benign neoplasm of skin of left upper limb, including shoulder: Secondary | ICD-10-CM | POA: Diagnosis not present

## 2023-02-10 DIAGNOSIS — L814 Other melanin hyperpigmentation: Secondary | ICD-10-CM | POA: Diagnosis not present

## 2023-02-10 DIAGNOSIS — Z85828 Personal history of other malignant neoplasm of skin: Secondary | ICD-10-CM | POA: Diagnosis not present

## 2023-02-10 DIAGNOSIS — Z08 Encounter for follow-up examination after completed treatment for malignant neoplasm: Secondary | ICD-10-CM | POA: Diagnosis not present

## 2023-02-12 DIAGNOSIS — H353133 Nonexudative age-related macular degeneration, bilateral, advanced atrophic without subfoveal involvement: Secondary | ICD-10-CM | POA: Diagnosis not present

## 2023-02-12 DIAGNOSIS — H43813 Vitreous degeneration, bilateral: Secondary | ICD-10-CM | POA: Diagnosis not present

## 2023-02-12 DIAGNOSIS — H2513 Age-related nuclear cataract, bilateral: Secondary | ICD-10-CM | POA: Diagnosis not present

## 2023-04-30 DIAGNOSIS — E7849 Other hyperlipidemia: Secondary | ICD-10-CM | POA: Diagnosis not present

## 2023-04-30 DIAGNOSIS — I10 Essential (primary) hypertension: Secondary | ICD-10-CM | POA: Diagnosis not present

## 2023-04-30 DIAGNOSIS — K900 Celiac disease: Secondary | ICD-10-CM | POA: Diagnosis not present

## 2023-04-30 DIAGNOSIS — E559 Vitamin D deficiency, unspecified: Secondary | ICD-10-CM | POA: Diagnosis not present

## 2023-04-30 DIAGNOSIS — Z0001 Encounter for general adult medical examination with abnormal findings: Secondary | ICD-10-CM | POA: Diagnosis not present

## 2023-04-30 DIAGNOSIS — E782 Mixed hyperlipidemia: Secondary | ICD-10-CM | POA: Diagnosis not present

## 2023-04-30 DIAGNOSIS — R7309 Other abnormal glucose: Secondary | ICD-10-CM | POA: Diagnosis not present

## 2023-04-30 DIAGNOSIS — K589 Irritable bowel syndrome without diarrhea: Secondary | ICD-10-CM | POA: Diagnosis not present

## 2023-04-30 DIAGNOSIS — J4520 Mild intermittent asthma, uncomplicated: Secondary | ICD-10-CM | POA: Diagnosis not present

## 2023-05-19 DIAGNOSIS — M791 Myalgia, unspecified site: Secondary | ICD-10-CM | POA: Diagnosis not present

## 2023-05-19 DIAGNOSIS — I10 Essential (primary) hypertension: Secondary | ICD-10-CM | POA: Diagnosis not present

## 2023-05-19 DIAGNOSIS — E782 Mixed hyperlipidemia: Secondary | ICD-10-CM | POA: Diagnosis not present

## 2023-05-29 ENCOUNTER — Other Ambulatory Visit: Payer: Self-pay | Admitting: Family Medicine

## 2023-05-29 DIAGNOSIS — Z1231 Encounter for screening mammogram for malignant neoplasm of breast: Secondary | ICD-10-CM

## 2023-06-10 ENCOUNTER — Ambulatory Visit: Payer: Medicare Other

## 2023-06-20 DIAGNOSIS — M62830 Muscle spasm of back: Secondary | ICD-10-CM | POA: Diagnosis not present

## 2023-06-20 DIAGNOSIS — M9905 Segmental and somatic dysfunction of pelvic region: Secondary | ICD-10-CM | POA: Diagnosis not present

## 2023-06-20 DIAGNOSIS — M9903 Segmental and somatic dysfunction of lumbar region: Secondary | ICD-10-CM | POA: Diagnosis not present

## 2023-06-20 DIAGNOSIS — M9902 Segmental and somatic dysfunction of thoracic region: Secondary | ICD-10-CM | POA: Diagnosis not present

## 2023-07-02 ENCOUNTER — Ambulatory Visit: Payer: Medicare Other

## 2023-07-11 ENCOUNTER — Ambulatory Visit: Payer: Medicare Other

## 2023-07-13 DIAGNOSIS — N644 Mastodynia: Secondary | ICD-10-CM | POA: Diagnosis not present

## 2023-07-13 DIAGNOSIS — R0781 Pleurodynia: Secondary | ICD-10-CM | POA: Diagnosis not present

## 2023-07-13 DIAGNOSIS — S2239XA Fracture of one rib, unspecified side, initial encounter for closed fracture: Secondary | ICD-10-CM | POA: Diagnosis not present

## 2023-07-31 ENCOUNTER — Ambulatory Visit
Admission: RE | Admit: 2023-07-31 | Discharge: 2023-07-31 | Disposition: A | Discharge status: Home / Self Care | Payer: Medicare Other | Source: Ambulatory Visit | Attending: Family Medicine | Admitting: Family Medicine

## 2023-07-31 DIAGNOSIS — Z1231 Encounter for screening mammogram for malignant neoplasm of breast: Secondary | ICD-10-CM | POA: Diagnosis not present

## 2023-08-21 DIAGNOSIS — K900 Celiac disease: Secondary | ICD-10-CM | POA: Diagnosis not present

## 2023-08-21 DIAGNOSIS — Z029 Encounter for administrative examinations, unspecified: Secondary | ICD-10-CM | POA: Diagnosis not present

## 2023-08-21 DIAGNOSIS — I10 Essential (primary) hypertension: Secondary | ICD-10-CM | POA: Diagnosis not present

## 2023-08-21 DIAGNOSIS — E782 Mixed hyperlipidemia: Secondary | ICD-10-CM | POA: Diagnosis not present

## 2023-08-21 DIAGNOSIS — G720 Drug-induced myopathy: Secondary | ICD-10-CM | POA: Diagnosis not present

## 2023-08-21 DIAGNOSIS — Z0001 Encounter for general adult medical examination with abnormal findings: Secondary | ICD-10-CM | POA: Diagnosis not present

## 2023-08-21 DIAGNOSIS — R7309 Other abnormal glucose: Secondary | ICD-10-CM | POA: Diagnosis not present

## 2023-08-21 DIAGNOSIS — J329 Chronic sinusitis, unspecified: Secondary | ICD-10-CM | POA: Diagnosis not present

## 2023-08-21 DIAGNOSIS — E559 Vitamin D deficiency, unspecified: Secondary | ICD-10-CM | POA: Diagnosis not present

## 2023-10-23 DIAGNOSIS — H43813 Vitreous degeneration, bilateral: Secondary | ICD-10-CM | POA: Diagnosis not present

## 2023-10-23 DIAGNOSIS — H353133 Nonexudative age-related macular degeneration, bilateral, advanced atrophic without subfoveal involvement: Secondary | ICD-10-CM | POA: Diagnosis not present

## 2023-10-23 DIAGNOSIS — H2513 Age-related nuclear cataract, bilateral: Secondary | ICD-10-CM | POA: Diagnosis not present

## 2023-12-24 ENCOUNTER — Ambulatory Visit: Admission: EM | Admit: 2023-12-24 | Discharge: 2023-12-24 | Disposition: A | Discharge status: Home / Self Care

## 2023-12-24 ENCOUNTER — Encounter: Payer: Self-pay | Admitting: Emergency Medicine

## 2023-12-24 DIAGNOSIS — R21 Rash and other nonspecific skin eruption: Secondary | ICD-10-CM | POA: Diagnosis not present

## 2023-12-24 MED ORDER — PREDNISONE 10 MG PO TABS: ORAL_TABLET | ORAL | 0 refills | Status: AC

## 2023-12-24 MED ORDER — TRIAMCINOLONE ACETONIDE 0.1 % EX CREA: 1.0000 | TOPICAL_CREAM | Freq: Two times a day (BID) | CUTANEOUS | 0 refills | Status: AC

## 2023-12-24 NOTE — ED Provider Notes (Signed)
 [facility]    CSN: [id number] Arrival date & time: 12/24/23  1335      History   Chief Complaint Chief Complaint  Patient presents with   Rash    HPI Olivia Collier is a 68 y.o. female.   Patient presents for evaluation of a rash that has remained persistent over the past several weeks.  States she had known exposure to poison oak (mows two yards and that is where the exposure happened).  States she has been applying a cortisone cream without final resolution of the rash.  It had originally been located on multiple areas of her body but now is primarily on her right forearm.  States the itching is preventing her from sleeping well at night.  No other concerns or symptoms are voiced.  The history is provided by the patient.  Rash Associated symptoms: no abdominal pain, no diarrhea, no fever, no headaches, no joint pain, no myalgias, no shortness of breath, no sore throat and not vomiting     Past Medical History:  Diagnosis Date   Allergy    Anemia    Asthma    Cancer (HCC)    cervical cancer age 48- no chemo, no radiation   Cataract    forming   Depression    History of giardia infection 06/26/2021   treated by [facility]   Hyperlipidemia    Hypertension    Macular degeneration    Obesity    Osteoporosis    past hx   Ulcerative colitis (HCC)    past hx    Patient Active Problem List   Diagnosis Date Noted   IBS (irritable bowel syndrome) 09/17/2021    Past Surgical History:  Procedure Laterality Date   CERVICAL CONE BIOPSY     CERVICAL FUSION  08/19/2004   COLONOSCOPY      OB History   No obstetric history on file.      Home Medications    Prior to Admission medications   Medication Sig Start Date End Date Taking? Authorizing Provider  bisoprolol-hydrochlorothiazide (ZIAC) 10-6.25 MG tablet Take 1 tablet by mouth daily.   Yes [provider]  Calcium-Vitamin D -Vitamin K (CALCIUM + D + K PO) Take by mouth daily.   Yes [provider]  cetirizine-pseudoephedrine (ZYRTEC-D) 5-120 MG per tablet Take 1 tablet by mouth daily at 2 PM.   Yes [provider]  Evolocumab (REPATHA [place]) Inject into the skin.   Yes [provider]  FLUoxetine (PROZAC) 20 MG capsule Take 20 mg by mouth 2 (two) times daily. 06/20/21  Yes [provider]  Multiple Vitamins-Minerals (ICAPS AREDS 2 PO) Take by mouth.   Yes [provider]  OVER THE COUNTER MEDICATION Focus Factor daily   Yes [provider]  predniSONE (DELTASONE) 10 MG tablet Take 6 tablets (60 mg total) by mouth daily with breakfast for 1 day, THEN 5 tablets (50 mg total) daily with breakfast for 1 day, THEN 4 tablets (40 mg total) daily with breakfast for 1 day, THEN 3 tablets (30 mg total) daily with breakfast for 1 day, THEN 2 tablets (20 mg total) daily with breakfast for 1 day, THEN 1 tablet (10 mg total) daily with breakfast for 1 day. 12/24/23 12/30/23 Yes Genene Kennel, FNP  pseudoephedrine-acetaminophen (TYLENOL SINUS) 30-500 MG TABS Take 1 tablet by mouth every 4 (four) hours as needed.   Yes [provider]  triamcinolone cream (KENALOG) 0.1 % Apply 1 Application topically  2 (two) times daily for 7 days. 12/24/23 12/31/23 Yes Genene Kennel, FNP    Family History Family History  Problem Relation Age of Onset   Colon cancer Father    Breast cancer Sister 21   Colon polyps Sister    Colon polyps Sister    Colon polyps Brother    Colon polyps Brother    Esophageal cancer Neg Hx    Rectal cancer Neg Hx    Stomach cancer Neg Hx    Pancreatic cancer Neg Hx     Social History Social History   Tobacco Use   Smoking status: Never   Smokeless tobacco: Never  Vaping Use   Vaping status: Never Used  Substance Use Topics   Alcohol use: No   Drug use: No     Allergies   Latex   Review of Systems Review of Systems  Constitutional:  Negative for chills and fever.  HENT:  Negative for congestion, rhinorrhea and  sore throat.   Eyes:  Negative for pain and redness.  Respiratory:  Negative for cough and shortness of breath.   Cardiovascular:  Negative for chest pain and palpitations.  Gastrointestinal:  Negative for abdominal pain, diarrhea and vomiting.  Genitourinary:  Negative for dysuria and urgency.  Musculoskeletal:  Negative for arthralgias and myalgias.  Skin:  Positive for rash.  Neurological:  Negative for dizziness and headaches.     Physical Exam Triage Vital Signs ED Triage Vitals  Encounter Vitals Group     BP 12/24/23 1347 130/81     Systolic BP Percentile --      Diastolic BP Percentile --      Pulse Rate 12/24/23 1347 82     Resp 12/24/23 1347 18     Temp 12/24/23 1347 97.9 F (36.6 C)     Temp Source 12/24/23 1347 Oral     SpO2 12/24/23 1347 95 %     Weight 12/24/23 1349 220 lb (99.8 kg)     Height 12/24/23 1349 5\' 8"  (1.727 m)     Head Circumference --      Peak Flow --      Pain Score 12/24/23 1349 0     Pain Loc --      Pain Education --      Exclude from Growth Chart --    No data found.  Updated Vital Signs BP 130/81 (BP Location: Right Arm)   Pulse 82   Temp 97.9 F (36.6 C) (Oral)   Resp 18   Ht 5\' 8"  (1.727 m)   Wt 220 lb (99.8 kg)   SpO2 95%   BMI 33.45 kg/m   Visual Acuity Right Eye Distance:   Left Eye Distance:   Bilateral Distance:    Right Eye Near:   Left Eye Near:    Bilateral Near:     Physical Exam Vitals and nursing note reviewed.  Constitutional:      Appearance: Normal appearance.  HENT:     Head: Normocephalic.  Cardiovascular:     Rate and Rhythm: Normal rate and regular rhythm.     Heart sounds: Normal heart sounds.  Pulmonary:     Effort: Pulmonary effort is normal.     Breath sounds: Normal breath sounds.  Abdominal:     General: Bowel sounds are normal.  Skin:    General: Skin is warm and dry.     Findings: Rash present.     Comments: Erythematous macular rash noted to the right proximal  posterior forearm  extending into the antecubital region.  No vesicles, pustules, or ulcerations appreciated.  Neurological:     General: No focal deficit present.     Mental Status: She is oriented to person, place, and time.  Psychiatric:        Mood and Affect: Mood normal.        Behavior: Behavior normal.        Thought Content: Thought content normal.        Judgment: Judgment normal.    UC Treatments / Results  Labs (all labs ordered are listed, but only abnormal results are displayed) Labs Reviewed - No data to display  EKG   Radiology No results found.  Procedures Procedures (including critical care time)  Medications Ordered in UC Medications - No data to display  Initial Impression / Assessment and Plan / UC Course  I have reviewed the triage vital signs and the nursing notes.  Pertinent labs & imaging results that were available during my care of the patient were reviewed by me and considered in my medical decision making (see chart for details).     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  Reasonable to provide a short steroid taper with topical triamcinolone cream to facilitate resolution.  Encouraged skin moisturization as her skin is dry.  Avoid exposure to heat/hot water.    Final Clinical Impressions(s) / UC Diagnoses   Final diagnoses:  Rash and nonspecific skin eruption     Discharge Instructions      Will plan to treat this rash with a short steroid taper and a topical steroid cream.  Moisturize your skin with a hypoallergenic product such as Aquaphor or CeraVe products.  Keep your skin cool/dry.     ED Prescriptions     Medication Sig Dispense Auth. Provider   predniSONE (DELTASONE) 10 MG tablet Take 6 tablets (60 mg total) by mouth daily with breakfast for 1 day, THEN 5 tablets (50 mg total) daily with breakfast for 1 day, THEN 4 tablets (40 mg total) daily with breakfast for 1  day, THEN 3 tablets (30 mg total) daily with breakfast for 1 day, THEN 2 tablets (20 mg total) daily with breakfast for 1 day, THEN 1 tablet (10 mg total) daily with breakfast for 1 day. 21 tablet Genene Kennel, FNP   triamcinolone cream (KENALOG) 0.1 % Apply 1 Application topically 2 (two) times daily for 7 days. 30 g Genene Kennel, FNP      PDMP not reviewed this encounter.   Genene Kennel, FNP 12/24/23 1455

## 2023-12-24 NOTE — Discharge Instructions (Addendum)
 Will plan to treat this rash with a short steroid taper and a topical steroid cream.  Moisturize your skin with a hypoallergenic product such as Aquaphor or CeraVe products.  Keep your skin cool/dry.

## 2023-12-24 NOTE — ED Triage Notes (Signed)
 Patient c/o poison oak x 1 month.  It was all over and has cleared up but now there's spots on right forearm she can't seem to get rid of.  The areas are red and itchy.  Patient has applied an OTC anti-itch cream.

## 2024-04-29 DIAGNOSIS — H2513 Age-related nuclear cataract, bilateral: Secondary | ICD-10-CM | POA: Diagnosis not present

## 2024-04-29 DIAGNOSIS — H25043 Posterior subcapsular polar age-related cataract, bilateral: Secondary | ICD-10-CM | POA: Diagnosis not present

## 2024-04-29 DIAGNOSIS — H43813 Vitreous degeneration, bilateral: Secondary | ICD-10-CM | POA: Diagnosis not present

## 2024-04-29 DIAGNOSIS — H353133 Nonexudative age-related macular degeneration, bilateral, advanced atrophic without subfoveal involvement: Secondary | ICD-10-CM | POA: Diagnosis not present

## 2024-07-07 ENCOUNTER — Other Ambulatory Visit: Payer: Self-pay | Admitting: Family Medicine

## 2024-07-07 DIAGNOSIS — Z1231 Encounter for screening mammogram for malignant neoplasm of breast: Secondary | ICD-10-CM

## 2024-08-06 ENCOUNTER — Inpatient Hospital Stay: Admission: RE | Admit: 2024-08-06 | Discharge: 2024-08-06 | Attending: Family Medicine | Admitting: Family Medicine

## 2024-08-06 DIAGNOSIS — Z1231 Encounter for screening mammogram for malignant neoplasm of breast: Secondary | ICD-10-CM
